# Patient Record
Sex: Female | Born: 1975 | Race: White | Hispanic: No | Marital: Married | Smoking: Never smoker
Health system: Southern US, Community
[De-identification: ages and names within clinical notes are randomized; demographics above are authoritative.]

---

## 1997-11-24 ENCOUNTER — Other Ambulatory Visit: Admission: RE | Admit: 1997-11-24 | Discharge: 1997-11-24 | Payer: Self-pay | Admitting: *Deleted

## 1998-04-19 ENCOUNTER — Other Ambulatory Visit: Admission: RE | Admit: 1998-04-19 | Discharge: 1998-04-19 | Payer: Self-pay | Admitting: *Deleted

## 1999-06-08 ENCOUNTER — Other Ambulatory Visit: Admission: RE | Admit: 1999-06-08 | Discharge: 1999-06-08 | Payer: Self-pay | Admitting: *Deleted

## 2000-06-17 ENCOUNTER — Other Ambulatory Visit: Admission: RE | Admit: 2000-06-17 | Discharge: 2000-06-17 | Payer: Self-pay | Admitting: *Deleted

## 2001-04-21 ENCOUNTER — Other Ambulatory Visit: Admission: RE | Admit: 2001-04-21 | Discharge: 2001-04-21 | Payer: Self-pay | Admitting: Obstetrics and Gynecology

## 2002-04-26 ENCOUNTER — Other Ambulatory Visit: Admission: RE | Admit: 2002-04-26 | Discharge: 2002-04-26 | Payer: Self-pay | Admitting: Obstetrics and Gynecology

## 2003-05-09 ENCOUNTER — Other Ambulatory Visit: Admission: RE | Admit: 2003-05-09 | Discharge: 2003-05-09 | Payer: Self-pay | Admitting: Obstetrics and Gynecology

## 2004-05-17 ENCOUNTER — Other Ambulatory Visit: Admission: RE | Admit: 2004-05-17 | Discharge: 2004-05-17 | Payer: Self-pay | Admitting: Obstetrics and Gynecology

## 2005-09-10 ENCOUNTER — Ambulatory Visit: Payer: Self-pay | Admitting: Internal Medicine

## 2006-10-09 ENCOUNTER — Ambulatory Visit: Payer: Self-pay | Admitting: Internal Medicine

## 2006-10-09 ENCOUNTER — Encounter: Payer: Self-pay | Admitting: Internal Medicine

## 2007-07-07 ENCOUNTER — Ambulatory Visit: Payer: Self-pay | Admitting: Internal Medicine

## 2007-08-05 ENCOUNTER — Ambulatory Visit: Payer: Self-pay | Admitting: Internal Medicine

## 2008-01-05 ENCOUNTER — Telehealth: Payer: Self-pay | Admitting: Internal Medicine

## 2008-01-05 ENCOUNTER — Ambulatory Visit: Payer: Self-pay | Admitting: Internal Medicine

## 2008-01-14 ENCOUNTER — Telehealth: Payer: Self-pay | Admitting: Internal Medicine

## 2008-10-03 ENCOUNTER — Ambulatory Visit: Payer: Self-pay | Admitting: Internal Medicine

## 2008-10-03 DIAGNOSIS — J069 Acute upper respiratory infection, unspecified: Secondary | ICD-10-CM | POA: Insufficient documentation

## 2008-10-03 DIAGNOSIS — J309 Allergic rhinitis, unspecified: Secondary | ICD-10-CM | POA: Insufficient documentation

## 2011-09-17 ENCOUNTER — Ambulatory Visit (INDEPENDENT_AMBULATORY_CARE_PROVIDER_SITE_OTHER): Payer: BC Managed Care – PPO | Admitting: Internal Medicine

## 2011-09-17 ENCOUNTER — Encounter: Payer: Self-pay | Admitting: Internal Medicine

## 2011-09-17 VITALS — BP 106/76 | HR 101 | Temp 98.1°F | Ht 65.5 in | Wt 137.0 lb

## 2011-09-17 DIAGNOSIS — M4120 Other idiopathic scoliosis, site unspecified: Secondary | ICD-10-CM

## 2011-09-17 DIAGNOSIS — J309 Allergic rhinitis, unspecified: Secondary | ICD-10-CM

## 2011-09-17 DIAGNOSIS — Z136 Encounter for screening for cardiovascular disorders: Secondary | ICD-10-CM

## 2011-09-17 DIAGNOSIS — Z0000 Encounter for general adult medical examination without abnormal findings: Secondary | ICD-10-CM

## 2011-09-17 DIAGNOSIS — M419 Scoliosis, unspecified: Secondary | ICD-10-CM

## 2011-09-17 DIAGNOSIS — Z8249 Family history of ischemic heart disease and other diseases of the circulatory system: Secondary | ICD-10-CM

## 2011-09-17 DIAGNOSIS — J302 Other seasonal allergic rhinitis: Secondary | ICD-10-CM

## 2011-09-17 LAB — BASIC METABOLIC PANEL
Calcium: 9.2 mg/dL (ref 8.4–10.5)
Creatinine, Ser: 0.8 mg/dL (ref 0.4–1.2)
GFR: 87.04 mL/min (ref >60.00)
Sodium: 141 mEq/L (ref 135–145)

## 2011-09-17 LAB — CBC WITH DIFFERENTIAL/PLATELET
Basophils Absolute: 0 10*3/uL (ref 0.0–0.1)
Basophils Relative: 0.3 % (ref 0.0–3.0)
Eosinophils Absolute: 0.1 10*3/uL (ref 0.0–0.7)
Hemoglobin: 12.6 g/dL (ref 12.0–15.0)
Lymphocytes Relative: 22 % (ref 12.0–46.0)
MCHC: 33.5 g/dL (ref 30.0–36.0)
Monocytes Relative: 6.2 % (ref 3.0–12.0)
Neutro Abs: 4.1 10*3/uL (ref 1.4–7.7)
Neutrophils Relative %: 69.1 % (ref 43.0–77.0)
RBC: 3.97 Mil/uL (ref 3.87–5.11)

## 2011-09-17 LAB — HEPATIC FUNCTION PANEL
AST: 21 U/L (ref 0–37)
Albumin: 4.3 g/dL (ref 3.5–5.2)
Alkaline Phosphatase: 36 U/L — ABNORMAL LOW (ref 39–117)
Bilirubin, Direct: 0 mg/dL (ref 0.0–0.3)

## 2011-09-17 LAB — LIPID PANEL
HDL: 64.8 mg/dL (ref >39.00)
LDL Cholesterol: 58 mg/dL (ref 0–99)
Total CHOL/HDL Ratio: 2
Triglycerides: 126 mg/dL (ref 0.0–149.0)

## 2011-09-17 NOTE — Progress Notes (Signed)
Subjective:    Patient ID: Holly Waters, female    DOB: 10-21-1975, 36 y.o.   MRN: [id number]  HPI Patient comes in as new patient visit . Previous care here was as a child   to reestablish  And get PV also to discuss recent famil hx dx of HCCm in mom when she presented with acute gi gall stone problem . She has not had syncope cp excessive sob arrythmia by hx and no sudden death in the family.  Going to [place]  [address] feet and to do hiking.  In [place] late [place] fall  And wants to make sure no preventable risk.  On ocps for year the last one for 4 years .  Review of Systems ROS:  GEN/ HEENT: No fever, significant weight changes sweats headaches vision problems hearing changes, CV/ PULM; No chest pain shortness of breath cough, syncope,edema  change in exercise tolerance. GI /GU: No adominal pain, vomiting, change in bowel habits. No blood in the stool. No significant GU symptoms. SKIN/HEME: ,no acute skin rashes suspicious lesions or bleeding. No lymphadenopathy, nodules, masses.  NEURO/ PSYCH:  No neurologic signs such as weakness numbness. No depression anxiety. IMM/ Allergy: No unusual infections.  Allergy .   On meds  With help REST of 12 system review negative except as per HPI Outpatient Encounter Prescriptions as of 09/17/2011  Medication Sig Dispense Refill  . drospirenone-ethinyl estradiol (YAZ,GIANVI,LORYNA) 3-0.02 MG tablet Take 1 tablet by mouth daily.      . fluticasone (FLONASE) 50 MCG/ACT nasal spray Place 2 sprays into the nose daily.       Past Medical History  Diagnosis Date  . Allergic rhinitis     History   Social History  . Marital Status: Married    Spouse Name: N/A    Number of Children: N/A  . Years of Education: N/A   Occupational History  . Not on file.   Social History Main Topics  . Smoking status: Never Smoker   . Smokeless tobacco: Not on file  . Alcohol Use: Yes  . Drug Use: No  . Sexually Active: Not on file   Other Topics Concern  . 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 No pertinent past surgical history.  Family History  Problem Relation Age of Onset  . Healthy Mother   . Multiple sclerosis Father   . Hypertension Father     both parents  . Diabetes Father   . Hypertrophic cardiomyopathy Mother   . Hypertension Mother     Not on File  Current Outpatient Prescriptions on File Prior to Visit  Medication Sig Dispense Refill  . drospirenone-ethinyl estradiol (YAZ,GIANVI,LORYNA) 3-0.02 MG tablet Take 1 tablet by mouth daily.      . fluticasone (FLONASE) 50 MCG/ACT nasal spray Place 2 sprays into the nose daily.        BP 106/76  Pulse 101  Temp(Src) 98.1 F (36.7 C) (Oral)  Ht 5' 5.5" (1.664 m)  Wt 137 lb (62.143 kg)  BMI 22.45 kg/m2  LMP 09/10/2011        Objective:   Physical Exam BP 106/76  Pulse 101  Temp(Src) 98.1 F (36.7 C) (Oral)  Ht 5' 5.5" (1.664 m)  Wt 137 lb (62.143 kg)  BMI 22.45 kg/m2  LMP 09/10/2011 Physical Exam: Vital  signs reviewed ZOX:WRUE is a well-developed well-nourished alert cooperative  white female who appears her stated age in no acute distress.  HEENT: normocephalic atraumatic , Eyes: PERRL EOM's full, conjunctiva clear, Nares: paten,t no deformity discharge or tenderness., Ears: no deformity EAC's clear TMs with normal landmarks. Mouth: clear OP, no lesions, edema.  Moist mucous membranes. Dentition in adequate repair. NECK: supple without masses, thyromegaly or bruits. CHEST/PULM:  Clear to auscultation and percussion breath sounds equal no wheeze , rales or rhonchi. No chest wall deformities or tenderness. Breast: normal by inspection . No dimpling, discharge, masses, tenderness or discharge .  CV: PMI is nondisplaced, S1 S2 no gallops, murmurs, rubs. Peripheral pulses are full without delay.No JVD . No m heard in 3  position and valsalva  ABDOMEN: Bowel sounds normal nontender  No guard or rebound, no hepato splenomegal no CVA tenderness.  No hernia. Extremtities:  No clubbing cyanosis or edema, no acute joint swelling or redness no focal atrophy NEURO:  Oriented x3, cranial nerves 3-12 appear to be intact, no obvious focal weakness,gait within normal limits no abnormal reflexes or asymmetrical SKIN: No acute rashes normal turgor, color, no bruising or petechiae. PSYCH: Oriented, good eye contact, no obvious depression anxiety, cognition and judgment appear normal. LN: no cervical axillary inguinal adenopathy   EKG  nsr with short pr  No delta waves nl voltage.      Assessment & Plan:  Preventive Health Care Counseled regarding healthy nutrition, exercise, sleep, injury prevention, calcium vit d and healthy weight . Fam hx first degree  HCM no sx of hx of arrythmia or sudden death or syncope otherwise per hx  Allergic rhinitis stable OCPS per  [facility]

## 2011-09-17 NOTE — Patient Instructions (Signed)
Will notify you  of labs when available. Your exam looks good today.  Will arrange echo or sound wave test of the heart .  It may require you see a cardiologist .  In which case we refer for this.

## 2011-09-19 NOTE — Progress Notes (Signed)
Quick Note:    Copy mailed to pt  ______

## 2011-10-01 ENCOUNTER — Other Ambulatory Visit: Payer: Self-pay

## 2011-10-01 ENCOUNTER — Ambulatory Visit (HOSPITAL_COMMUNITY): Payer: BC Managed Care – PPO | Attending: Cardiology

## 2011-10-01 DIAGNOSIS — M419 Scoliosis, unspecified: Secondary | ICD-10-CM

## 2011-10-01 DIAGNOSIS — Z8249 Family history of ischemic heart disease and other diseases of the circulatory system: Secondary | ICD-10-CM

## 2011-10-01 DIAGNOSIS — Z136 Encounter for screening for cardiovascular disorders: Secondary | ICD-10-CM

## 2011-10-08 ENCOUNTER — Telehealth: Payer: Self-pay | Admitting: Family Medicine

## 2011-10-08 NOTE — Telephone Encounter (Signed)
Pls advise if pt can be given results.

## 2011-10-08 NOTE — Telephone Encounter (Signed)
Tell her the ECHO was normal. No signs of hypertrophic disease

## 2011-10-08 NOTE — Telephone Encounter (Signed)
Pt had 2D echo last Tues. She is still waiting for [facility] to call her with results. Can another provider review and call pt? Please advise. Thanks

## 2011-10-09 NOTE — Telephone Encounter (Signed)
Left a message for pt to return call about results.

## 2011-10-09 NOTE — Telephone Encounter (Signed)
Called and spoke with pt and pt is aware of Echo results.

## 2011-10-09 NOTE — Telephone Encounter (Signed)
Pt returned call. Pls call back asap.    °

## 2011-12-10 ENCOUNTER — Encounter (HOSPITAL_COMMUNITY): Payer: Self-pay | Admitting: Anesthesiology

## 2011-12-10 ENCOUNTER — Ambulatory Visit (HOSPITAL_COMMUNITY)
Admission: EM | Admit: 2011-12-10 | Discharge: 2011-12-11 | Disposition: A | Discharge status: Home / Self Care | Payer: BC Managed Care – PPO | Attending: Surgery | Admitting: Surgery

## 2011-12-10 ENCOUNTER — Inpatient Hospital Stay (HOSPITAL_COMMUNITY): Admit: 2011-12-10 | Payer: Self-pay

## 2011-12-10 ENCOUNTER — Emergency Department (HOSPITAL_COMMUNITY): Payer: BC Managed Care – PPO

## 2011-12-10 ENCOUNTER — Emergency Department (HOSPITAL_COMMUNITY): Payer: BC Managed Care – PPO | Admitting: Anesthesiology

## 2011-12-10 ENCOUNTER — Encounter (HOSPITAL_COMMUNITY)
Admission: EM | Disposition: A | Discharge status: Home / Self Care | Payer: Self-pay | Source: Home / Self Care | Attending: Emergency Medicine

## 2011-12-10 ENCOUNTER — Encounter (HOSPITAL_COMMUNITY): Payer: Self-pay

## 2011-12-10 DIAGNOSIS — K8000 Calculus of gallbladder with acute cholecystitis without obstruction: Secondary | ICD-10-CM | POA: Insufficient documentation

## 2011-12-10 DIAGNOSIS — R1011 Right upper quadrant pain: Secondary | ICD-10-CM

## 2011-12-10 DIAGNOSIS — K8020 Calculus of gallbladder without cholecystitis without obstruction: Secondary | ICD-10-CM

## 2011-12-10 DIAGNOSIS — R109 Unspecified abdominal pain: Secondary | ICD-10-CM

## 2011-12-10 DIAGNOSIS — J309 Allergic rhinitis, unspecified: Secondary | ICD-10-CM | POA: Insufficient documentation

## 2011-12-10 HISTORY — PX: CHOLECYSTECTOMY: SHX55

## 2011-12-10 LAB — CBC WITH DIFFERENTIAL/PLATELET
Basophils Absolute: 0 10*3/uL (ref 0.0–0.1)
Lymphocytes Relative: 13 % (ref 12–46)
Lymphs Abs: 1.3 10*3/uL (ref 0.7–4.0)
MCV: 95.4 fL (ref 78.0–100.0)
Neutro Abs: 7.6 10*3/uL (ref 1.7–7.7)
Neutrophils Relative %: 80 % — ABNORMAL HIGH (ref 43–77)
Platelets: 265 10*3/uL (ref 150–400)
RBC: 4.15 MIL/uL (ref 3.87–5.11)
RDW: 13.5 % (ref 11.5–15.5)
WBC: 9.5 10*3/uL (ref 4.0–10.5)

## 2011-12-10 LAB — COMPREHENSIVE METABOLIC PANEL
ALT: 45 U/L — ABNORMAL HIGH (ref 0–35)
AST: 28 U/L (ref 0–37)
Alkaline Phosphatase: 65 U/L (ref 39–117)
CO2: 18 mEq/L — ABNORMAL LOW (ref 19–32)
Chloride: 100 mEq/L (ref 96–112)
GFR calc Af Amer: >90 mL/min (ref >90)
GFR calc non Af Amer: >90 mL/min (ref >90)
Glucose, Bld: 100 mg/dL — ABNORMAL HIGH (ref 70–99)
Potassium: 4.1 mEq/L (ref 3.5–5.1)
Sodium: 133 mEq/L — ABNORMAL LOW (ref 135–145)

## 2011-12-10 LAB — URINALYSIS, ROUTINE W REFLEX MICROSCOPIC
Leukocytes, UA: NEGATIVE
Nitrite: NEGATIVE
Specific Gravity, Urine: 1.006 (ref 1.005–1.030)
Urobilinogen, UA: 0.2 mg/dL (ref 0.0–1.0)

## 2011-12-10 LAB — URINE MICROSCOPIC-ADD ON

## 2011-12-10 SURGERY — LAPAROSCOPIC CHOLECYSTECTOMY WITH INTRAOPERATIVE CHOLANGIOGRAM
Anesthesia: General | Site: Abdomen | Wound class: Clean Contaminated

## 2011-12-10 MED ORDER — SODIUM CHLORIDE 0.9 % IV SOLN
INTRAVENOUS | Status: DC | PRN
  Administered 2011-12-10: 16:00:00

## 2011-12-10 MED ORDER — HYDROMORPHONE HCL PF 1 MG/ML IJ SOLN
1.0000 mg | INTRAMUSCULAR | Status: DC | PRN
  Administered 2011-12-10 (×2): 1 mg via INTRAVENOUS
  Filled 2011-12-10 (×2): qty 1

## 2011-12-10 MED ORDER — OXYCODONE-ACETAMINOPHEN 5-325 MG PO TABS
1.0000 | ORAL_TABLET | ORAL | Status: DC | PRN
  Administered 2011-12-11 (×2): 1 via ORAL
  Filled 2011-12-10 (×2): qty 1

## 2011-12-10 MED ORDER — ONDANSETRON HCL 4 MG/2ML IJ SOLN: 4.0000 mg | Freq: Four times a day (QID) | INTRAMUSCULAR | Status: DC | PRN

## 2011-12-10 MED ORDER — BUPIVACAINE-EPINEPHRINE 0.25% -1:200000 IJ SOLN
INTRAMUSCULAR | Status: DC | PRN
  Administered 2011-12-10: 13 mL

## 2011-12-10 MED ORDER — LACTATED RINGERS IV SOLN
INTRAVENOUS | Status: DC | PRN
  Administered 2011-12-10 (×2): via INTRAVENOUS

## 2011-12-10 MED ORDER — ONDANSETRON HCL 4 MG PO TABS: 4.0000 mg | ORAL_TABLET | Freq: Four times a day (QID) | ORAL | Status: DC | PRN

## 2011-12-10 MED ORDER — DEXTROSE 5 % IV SOLN
1.0000 g | INTRAVENOUS | Status: DC | PRN
  Administered 2011-12-10: 1 g via INTRAVENOUS

## 2011-12-10 MED ORDER — DEXAMETHASONE SODIUM PHOSPHATE 4 MG/ML IJ SOLN
INTRAMUSCULAR | Status: DC | PRN
  Administered 2011-12-10: 4 mg via INTRAVENOUS

## 2011-12-10 MED ORDER — ROCURONIUM BROMIDE 100 MG/10ML IV SOLN
INTRAVENOUS | Status: DC | PRN
  Administered 2011-12-10: 50 mg via INTRAVENOUS

## 2011-12-10 MED ORDER — LACTATED RINGERS IV SOLN
INTRAVENOUS | Status: DC
  Administered 2011-12-10: 14:00:00 via INTRAVENOUS
  Administered 2011-12-11: 50 mL/h via INTRAVENOUS

## 2011-12-10 MED ORDER — NEOSTIGMINE METHYLSULFATE 1 MG/ML IJ SOLN
INTRAMUSCULAR | Status: DC | PRN
  Administered 2011-12-10: 3 mg via INTRAVENOUS

## 2011-12-10 MED ORDER — PROPOFOL 10 MG/ML IV EMUL
INTRAVENOUS | Status: DC | PRN
  Administered 2011-12-10: 160 mg via INTRAVENOUS

## 2011-12-10 MED ORDER — ONDANSETRON HCL 4 MG/2ML IJ SOLN
INTRAMUSCULAR | Status: DC | PRN
  Administered 2011-12-10: 4 mg via INTRAVENOUS

## 2011-12-10 MED ORDER — MIDAZOLAM HCL 5 MG/5ML IJ SOLN
INTRAMUSCULAR | Status: DC | PRN
  Administered 2011-12-10: 2 mg via INTRAVENOUS

## 2011-12-10 MED ORDER — GLYCOPYRROLATE 0.2 MG/ML IJ SOLN
INTRAMUSCULAR | Status: DC | PRN
  Administered 2011-12-10: 0.4 mg via INTRAVENOUS

## 2011-12-10 MED ORDER — HYDROMORPHONE HCL PF 1 MG/ML IJ SOLN
0.2500 mg | INTRAMUSCULAR | Status: DC | PRN
  Administered 2011-12-10 (×2): 0.25 mg via INTRAVENOUS

## 2011-12-10 MED ORDER — SODIUM CHLORIDE 0.9 % IR SOLN
Status: DC | PRN
  Administered 2011-12-10 (×2): 1

## 2011-12-10 MED ORDER — FENTANYL CITRATE 0.05 MG/ML IJ SOLN
INTRAMUSCULAR | Status: DC | PRN
  Administered 2011-12-10 (×2): 100 ug via INTRAVENOUS
  Administered 2011-12-10 (×2): 50 ug via INTRAVENOUS
  Administered 2011-12-10: 100 ug via INTRAVENOUS
  Administered 2011-12-10: 50 ug via INTRAVENOUS

## 2011-12-10 MED ORDER — 0.9 % SODIUM CHLORIDE (POUR BTL) OPTIME
TOPICAL | Status: DC | PRN
  Administered 2011-12-10: 1000 mL

## 2011-12-10 MED ORDER — PHENYLEPHRINE HCL 10 MG/ML IJ SOLN
INTRAMUSCULAR | Status: DC | PRN
  Administered 2011-12-10: 80 ug via INTRAVENOUS

## 2011-12-10 MED ORDER — LIDOCAINE HCL 1 % IJ SOLN
INTRAMUSCULAR | Status: DC | PRN
  Administered 2011-12-10: 70 mg via INTRADERMAL

## 2011-12-10 MED ORDER — ENOXAPARIN SODIUM 40 MG/0.4ML ~~LOC~~ SOLN
40.0000 mg | SUBCUTANEOUS | Status: DC
  Administered 2011-12-11: 40 mg via SUBCUTANEOUS
  Filled 2011-12-10 (×2): qty 0.4

## 2011-12-10 SURGICAL SUPPLY — 41 items
APPLIER CLIP ROT 10 11.4 M/L (STAPLE) ×4
BLADE SURG ROTATE 9660 (MISCELLANEOUS) IMPLANT
CANISTER SUCTION 2500CC (MISCELLANEOUS) ×2 IMPLANT
CHLORAPREP W/TINT 26ML (MISCELLANEOUS) ×2 IMPLANT
CLIP APPLIE ROT 10 11.4 M/L (STAPLE) ×2 IMPLANT
CLOTH BEACON ORANGE TIMEOUT ST (SAFETY) ×2 IMPLANT
COVER MAYO STAND STRL (DRAPES) ×2 IMPLANT
COVER SURGICAL LIGHT HANDLE (MISCELLANEOUS) ×2 IMPLANT
DECANTER SPIKE VIAL GLASS SM (MISCELLANEOUS) ×4 IMPLANT
DERMABOND ADVANCED (GAUZE/BANDAGES/DRESSINGS) ×1
DERMABOND ADVANCED .7 DNX12 (GAUZE/BANDAGES/DRESSINGS) ×1 IMPLANT
DRAPE C-ARM 42X72 X-RAY (DRAPES) ×2 IMPLANT
DRAPE UTILITY 15X26 W/TAPE STR (DRAPE) ×4 IMPLANT
DRAPE WARM FLUID 44X44 (DRAPE) ×2 IMPLANT
ELECT REM PT RETURN 9FT ADLT (ELECTROSURGICAL) ×2
ELECTRODE REM PT RTRN 9FT ADLT (ELECTROSURGICAL) ×1 IMPLANT
GLOVE BIO SURGEON STRL SZ 6.5 (GLOVE) ×2 IMPLANT
GLOVE BIO SURGEON STRL SZ8 (GLOVE) ×2 IMPLANT
GLOVE BIOGEL PI IND STRL 6.5 (GLOVE) ×1 IMPLANT
GLOVE BIOGEL PI IND STRL 8 (GLOVE) ×1 IMPLANT
GLOVE BIOGEL PI INDICATOR 6.5 (GLOVE) ×1
GLOVE BIOGEL PI INDICATOR 8 (GLOVE) ×1
GOWN STRL NON-REIN LRG LVL3 (GOWN DISPOSABLE) ×8 IMPLANT
KIT BASIN OR (CUSTOM PROCEDURE TRAY) ×2 IMPLANT
KIT ROOM TURNOVER OR (KITS) ×2 IMPLANT
NS IRRIG 1000ML POUR BTL (IV SOLUTION) ×4 IMPLANT
PAD ARMBOARD 7.5X6 YLW CONV (MISCELLANEOUS) ×2 IMPLANT
POUCH SPECIMEN RETRIEVAL 10MM (ENDOMECHANICALS) ×2 IMPLANT
SCISSORS LAP 5X35 DISP (ENDOMECHANICALS) IMPLANT
SET CHOLANGIOGRAPH 5 50 .035 (SET/KITS/TRAYS/PACK) ×2 IMPLANT
SET IRRIG TUBING LAPAROSCOPIC (IRRIGATION / IRRIGATOR) ×2 IMPLANT
SLEEVE ENDOPATH XCEL 5M (ENDOMECHANICALS) ×2 IMPLANT
SPECIMEN JAR SMALL (MISCELLANEOUS) ×2 IMPLANT
SUT MNCRL AB 4-0 PS2 18 (SUTURE) ×2 IMPLANT
SUT VICRYL 0 UR6 27IN ABS (SUTURE) ×4 IMPLANT
TOWEL OR 17X24 6PK STRL BLUE (TOWEL DISPOSABLE) ×2 IMPLANT
TOWEL OR 17X26 10 PK STRL BLUE (TOWEL DISPOSABLE) ×2 IMPLANT
TRAY LAPAROSCOPIC (CUSTOM PROCEDURE TRAY) ×2 IMPLANT
TROCAR XCEL BLUNT TIP 100MML (ENDOMECHANICALS) ×2 IMPLANT
TROCAR XCEL NON-BLD 11X100MML (ENDOMECHANICALS) ×2 IMPLANT
TROCAR XCEL NON-BLD 5MMX100MML (ENDOMECHANICALS) ×2 IMPLANT

## 2011-12-10 NOTE — Interval H&P Note (Signed)
History and Physical Interval Note:  12/10/2011 2:08 PM  Holly Waters  has presented today for surgery, with the diagnosis of gall bladder disease  The various methods of treatment have been discussed with the patient and family. After consideration of risks, benefits and other options for treatment, the patient has consented to  Procedure(s) (LRB): LAPAROSCOPIC CHOLECYSTECTOMY WITH INTRAOPERATIVE CHOLANGIOGRAM (N/A) as a surgical intervention .  The patient's history has been reviewed, patient examined, no change in status, stable for surgery.  I have reviewed the patient's chart and labs.  Questions were answered to the patient's satisfaction.     Elly Haffey A.   

## 2011-12-10 NOTE — ED Notes (Signed)
Pt c/o constant epigastric and RUQ pain since Sunday that "feels like acid". Pt denies N/V, but reports decreased appetite. States she was seem at urgent care and sent to ED for further eval. MD at bedside

## 2011-12-10 NOTE — ED Provider Notes (Signed)
Patient presented with RUQ pain.   [place]: Cholelithiasis with a 2.8 cm diameter gallstones fixed in position  at the gallbladder neck with associated sonographic Murphy's sign,  cannot exclude early acute cholecystitis.  No biliary dilatation.  Patient reassessed by Dr. Weldon Inches and still tender in RUQ. Surgical consult called by physician and patient admitted.   Pixie Casino, PA-C 12/10/11 1552

## 2011-12-10 NOTE — Anesthesia Preprocedure Evaluation (Addendum)
Anesthesia Evaluation  Patient identified by MRN, date of birth, ID band Patient awake    Reviewed: Allergy & Precautions, H&P , NPO status , Patient's Chart, lab work & pertinent test results  History of Anesthesia Complications Negative for: history of anesthetic complications  Airway Mallampati: I TM Distance: >3 FB Neck ROM: Full    Dental  (+) Teeth Intact and Dental Advisory Given   Pulmonary neg pulmonary ROS,          Cardiovascular negative cardio ROS      Neuro/Psych negative neurological ROS     GI/Hepatic Neg liver ROS, GB disease   Endo/Other  negative endocrine ROS  Renal/GU negative Renal ROS     Musculoskeletal negative musculoskeletal ROS (+)   Abdominal   Peds  Hematology negative hematology ROS (+)   Anesthesia Other Findings   Reproductive/Obstetrics LMP 11/30/11                           Anesthesia Physical Anesthesia Plan  ASA: I  Anesthesia Plan: General   Post-op Pain Management:    Induction: Intravenous  Airway Management Planned: Oral ETT  Additional Equipment:   Intra-op Plan:   Post-operative Plan: Extubation in OR  Informed Consent:   Dental advisory given  Plan Discussed with: CRNA and Anesthesiologist  Anesthesia Plan Comments:         Anesthesia Quick Evaluation

## 2011-12-10 NOTE — ED Notes (Signed)
Pt to US with tech

## 2011-12-10 NOTE — ED Notes (Signed)
Pt sent here from [facility] for possible cholecystitis, symptom onset Sunday evening sts right upper abd pain.

## 2011-12-10 NOTE — Transfer of Care (Signed)
Immediate Anesthesia Transfer of Care Note  Patient: Holly Waters  Procedure(s) Performed: Procedure(s) (LRB): LAPAROSCOPIC CHOLECYSTECTOMY WITH INTRAOPERATIVE CHOLANGIOGRAM (N/A)  Patient Location: PACU  Anesthesia Type: General  Level of Consciousness: awake, alert  and oriented  Airway & Oxygen Therapy: Patient Spontanous Breathing  Post-op Assessment: Report given to PACU RN and Post -op Vital signs reviewed and stable  Post vital signs: Reviewed and stable  Complications: No apparent anesthesia complications

## 2011-12-10 NOTE — ED Notes (Addendum)
Patient to or via wheelchair.all belongings given to husband at bedside

## 2011-12-10 NOTE — H&P (Signed)
Pt seen examined and agree with h and p.  Acute cholecystitis on exam.  Recommend laparoscopic cholecystectomy and cholangiogram.  The procedure has been discussed with the patient. Operative and non operative treatments have been discussed. Risks of surgery include bleeding, infection,  Common bile duct injury,  Injury to the stomach,liver, colon,small intestine, abdominal wall,  Diaphragm,  Major blood vessels,  And the need for an open procedure.  Other risks include worsening of medical problems, death,  DVT and pulmonary embolism, and cardiovascular events.   Medical options have also been discussed. The patient has been informed of long term expectations of surgery and non surgical options,  The patient agrees to proceed.

## 2011-12-10 NOTE — Anesthesia Postprocedure Evaluation (Signed)
Anesthesia Post Note  Patient: Holly Waters  Procedure(s) Performed: Procedure(s) (LRB): LAPAROSCOPIC CHOLECYSTECTOMY WITH INTRAOPERATIVE CHOLANGIOGRAM (N/A)  Anesthesia type: General  Patient location: PACU  Post pain: Pain level controlled and Adequate analgesia  Post assessment: Post-op Vital signs reviewed, Patient's Cardiovascular Status Stable, Respiratory Function Stable, Patent Airway and Pain level controlled  Last Vitals:  Filed Vitals:   12/10/11 1622  BP: 126/70  Pulse: 86  Temp: 36.2 C  Resp: 17    Post vital signs: Reviewed and stable  Level of consciousness: awake, alert  and oriented  Complications: No apparent anesthesia complications

## 2011-12-10 NOTE — Anesthesia Procedure Notes (Signed)
Procedure Name: Intubation Date/Time: 12/10/2011 2:22 PM Performed by: Leona Singleton A Pre-anesthesia Checklist: Patient identified Patient Re-evaluated:Patient Re-evaluated prior to inductionOxygen Delivery Method: Circle system utilized Preoxygenation: Pre-oxygenation with 100% oxygen Intubation Type: IV induction Ventilation: Mask ventilation without difficulty Laryngoscope Size: Miller and 2 Grade View: Grade I Tube type: Oral Tube size: 7.0 mm Number of attempts: 1 Airway Equipment and Method: Stylet Placement Confirmation: ETT inserted through vocal cords under direct vision,  positive ETCO2 and breath sounds checked- equal and bilateral Secured at: 21 cm Tube secured with: Tape Dental Injury: Teeth and Oropharynx as per pre-operative assessment

## 2011-12-10 NOTE — Interval H&P Note (Signed)
History and Physical Interval Note:  12/10/2011 2:08 PM  Holly Waters  has presented today for surgery, with the diagnosis of gall bladder disease  The various methods of treatment have been discussed with the patient and family. After consideration of risks, benefits and other options for treatment, the patient has consented to  Procedure(s) (LRB): LAPAROSCOPIC CHOLECYSTECTOMY WITH INTRAOPERATIVE CHOLANGIOGRAM (N/A) as a surgical intervention .  The patient's history has been reviewed, patient examined, no change in status, stable for surgery.  I have reviewed the patient's chart and labs.  Questions were answered to the patient's satisfaction.     Taejon Irani A.

## 2011-12-10 NOTE — Preoperative (Signed)
Beta Blockers   Reason not to administer Beta Blockers:Not Applicable 

## 2011-12-10 NOTE — Op Note (Signed)
Laparoscopic Cholecystectomy with IOC Procedure Note  Indications: This patient presents with symptomatic gallbladder disease and will undergo laparoscopic cholecystectomy.  Pre-operative Diagnosis: Calculus of gallbladder with acute cholecystitis, without mention of obstruction  Post-operative Diagnosis: Same  Surgeon: Izabelle Daus A.   Assistants: OR  staff  Anesthesia: General endotracheal anesthesia and Local anesthesia 0.25.% bupivacaine, with epinephrine  ASA Class: 1  Procedure Details  The patient was seen again in the Holding Room. The risks, benefits, complications, treatment options, and expected outcomes were discussed with the patient. The possibilities of reaction to medication, pulmonary aspiration, perforation of viscus, bleeding, recurrent infection, finding a normal gallbladder, the need for additional procedures, failure to diagnose a condition, the possible need to convert to an open procedure, and creating a complication requiring transfusion or operation were discussed with the patient. The patient and/or family concurred with the proposed plan, giving informed consent. The site of surgery properly noted/marked. The patient was taken to Operating Room, identified as Holly Waters and the procedure verified as Laparoscopic Cholecystectomy with Intraoperative Cholangiograms. A Time Out was held and the above information confirmed.  Prior to the induction of general anesthesia, antibiotic prophylaxis was administered. General endotracheal anesthesia was then administered and tolerated well. After the induction, the abdomen was prepped in the usual sterile fashion. The patient was positioned in the supine position with the left arm comfortably tucked, along with some reverse Trendelenburg.  Local anesthetic agent was injected into the skin near the umbilicus and an incision made. The midline fascia was incised and the Hasson technique was used to introduce a 12 mm port  under direct vision. It was secured with a figure of eight Vicryl suture placed in the usual fashion. Pneumoperitoneum was then created with CO2 and tolerated well without any adverse changes in the patient's vital signs. Additional 5 mm and an 11 mm epigastric trocars were introduced under direct vision. All skin incisions were infiltrated with a local anesthetic agent before making the incision and placing the trocars.   The gallbladder was identified, the fundus grasped and retracted cephalad. Adhesions were lysed bluntly and with the electrocautery where indicated, taking care not to injure any adjacent organs or viscus. The infundibulum was grasped and retracted laterally, exposing the peritoneum overlying the triangle of Calot. This was then divided and exposed in a blunt fashion. The cystic duct was clearly identified and bluntly dissected circumferentially. The junctions of the gallbladder, cystic duct and common bile duct were clearly identified prior to the division of any linear structure.   An incision was made in the cystic duct and the cholangiogram catheter introduced. The catheter was secured using an endoclip. The study showed no stones and good visualization of the distal and proximal biliary tree. The catheter was then removed.   The cystic duct was then  ligated with surgical clips  on the patient side and  clipped on the gallbladder side and divided. The cystic artery was identified, dissected free, ligated with clips and divided as well. Posterior cystic artery clipped and divided.  The gallbladder was dissected from the liver bed in retrograde fashion with the electrocautery. The gallbladder was removed. The liver bed was irrigated and inspected. Hemostasis was achieved with the electrocautery and clips. Copious irrigation was utilized and was repeatedly aspirated until clear all particulate matter.  Laparoscopy revealed no evidence of organ injury,  Bleeding or bile  leakage.  Pneumoperitoneum was completely reduced after viewing removal of the trocars under direct vision. The wound was thoroughly  irrigated and the fascia was then closed with a figure of eight suture; the skin was then closed with 4 O monocryl  and a sterile Dermabond was applied.  Instrument, sponge, and needle counts were correct at closure and at the conclusion of the case.   Findings: Cholecystitis with Cholelithiasis  Estimated Blood Loss: less than 50 mL         Drains: none         Total IV Fluids: 1500 mL         Specimens: Gallbladder           Complications: None; patient tolerated the procedure well.         Disposition: PACU - hemodynamically stable.         Condition: stable

## 2011-12-10 NOTE — ED Provider Notes (Signed)
History     CSN: [id number]  Arrival date & time 12/10/11  1009   First MD Initiated Contact with Patient 12/10/11 1036      Chief Complaint  Patient presents with  . Abdominal Pain    (Consider location/radiation/quality/duration/timing/severity/associated sxs/prior treatment) Patient is a 36 y.o. female presenting with abdominal pain. The history is provided by the patient.  Abdominal Pain The primary symptoms of the illness include abdominal pain. The primary symptoms of the illness do not include fever, shortness of breath, nausea, vomiting, diarrhea or dysuria.  Symptoms associated with the illness do not include chills or back pain.   the patient is a 36 year old, female, with no significant past medical history, who presents to the emergency department complaining of epigastric abdominal pain, with a burning sensation in her chest.  The pain does not radiate.  It has been constant for the past 3 days.  She has a decreased appetite.  She denies cough, or shortness of breath.  She denies nausea, vomiting, diarrhea, or urinary tract symptoms.  She denies alcohol use and she denies prior abdominal surgery.  She is taking birth control pills  Past Medical History  Diagnosis Date  . Allergic rhinitis     No past surgical history on file.  Family History  Problem Relation Age of Onset  . Healthy Mother   . Multiple sclerosis Father   . Hypertension Father     both parents  . Diabetes Father   . Hypertrophic cardiomyopathy Mother   . Hypertension Mother     History  Substance Use Topics  . Smoking status: Never Smoker   . Smokeless tobacco: Not on file  . Alcohol Use: Yes     soically    OB History    Grav Para Term Preterm Abortions TAB SAB Ect Mult Living                  Review of Systems  Constitutional: Negative for fever and chills.  HENT: Negative for congestion.   Eyes: Negative for redness.  Respiratory: Negative for cough and shortness of breath.     Cardiovascular: Negative for chest pain.  Gastrointestinal: Positive for abdominal pain. Negative for nausea, vomiting and diarrhea.  Genitourinary: Negative for dysuria.  Musculoskeletal: Negative for back pain.  Skin: Negative for rash.  Neurological: Negative for headaches.  Psychiatric/Behavioral: Negative for confusion.  All other systems reviewed and are negative.    Allergies  Review of patient's allergies indicates no known allergies.  Home Medications   Current Outpatient Rx  Name Route Sig Dispense Refill  . DROSPIRENONE-ETHINYL ESTRADIOL 3-0.02 MG PO TABS Oral Take 1 tablet by mouth daily.    Marland Kitchen FLUTICASONE PROPIONATE 50 MCG/ACT NA SUSP Nasal Place 2 sprays into the nose daily as needed. allergies      BP 125/87  Pulse 79  Temp 98.3 F (36.8 C) (Oral)  Resp 16  SpO2 100%  LMP 12/03/2011  Physical Exam  Nursing note and vitals reviewed. Constitutional: She is oriented to person, place, and time. She appears well-developed and well-nourished.  HENT:  Head: Normocephalic and atraumatic.  Eyes: Conjunctivae are normal.  Neck: Normal range of motion. Neck supple.  Cardiovascular: Normal rate.   No murmur heard. Pulmonary/Chest: Effort normal and breath sounds normal.  Abdominal: Soft. She exhibits no distension and no mass. There is tenderness. There is no rebound and no guarding.       Epigastric and right upper quadrant tenderness, with positive Murphy sign  Musculoskeletal: Normal range of motion.  Neurological: She is alert and oriented to person, place, and time.  Skin: Skin is warm and dry.  Psychiatric: She has a normal mood and affect. Thought content normal.    ED Course  Procedures (including critical care time) 36 year old, female, with no past medical history on birth control pills, presents emergency department with epigastric pain for the past 3 days.  No other symptoms.  She said a positive Murphy sign.  We will perform laboratory testing, and  ultrasound, for evaluation.  Presently, she does not want pain medications  Labs Reviewed  CBC WITH DIFFERENTIAL - Abnormal; Notable for the following:    Neutrophils Relative 80 (*)     All other components within normal limits  COMPREHENSIVE METABOLIC PANEL - Abnormal; Notable for the following:    Sodium 133 (*)     CO2 18 (*)     Glucose, Bld 100 (*)     BUN 5 (*)     ALT 45 (*)     All other components within normal limits  CK  POCT PREGNANCY, URINE  URINALYSIS, ROUTINE W REFLEX MICROSCOPIC   No results found.   No diagnosis found.  We'll send to be CDU for completion of her testing.  The PA will evaluate the ultrasound, and laboratory values and then reassess.  The patient and make final disposition  MDM  Abdominal pain        Cheri Guppy, MD 12/11/11 1958

## 2011-12-10 NOTE — H&P (Signed)
Holly Waters is an 36 y.o. female.   Chief Complaint: abdominal pain HPI: This is a 36 yo WF who presents to [facility] today after being seen at the [facility] for c/o of RUQ abdominal pain which has been more or less constant since last Sunday. She describes this pain as a " burning sensation" and initially thought it was heartburn, and took some antacids for it which did not alleviate her symptoms. Her symptoms are not affected by food, but she states that she has not felt like eating since this began. She has never had this kind of pain before and has had no previous abdominal surgeries. She states that she has had + flatus and BM + nausea but no emesis.  Her last attempt at taking any food was yesterday.  Past Medical History  Diagnosis Date  . Allergic rhinitis     No past surgical history on file.  Family History  Problem Relation Age of Onset  . Healthy Mother   . Multiple sclerosis Father   . Hypertension Father     both parents  . Diabetes Father   . Hypertrophic cardiomyopathy Mother   . Hypertension Mother    Social History:  reports that she has never smoked. She does not have any smokeless tobacco history on file. She reports that she drinks alcohol. She reports that she does not use illicit drugs.  Allergies: Codeine (causes her to itch)   (Not in a hospital admission)  Results for orders placed during the hospital encounter of 12/10/11 (from the past 48 hour(s))  CBC WITH DIFFERENTIAL     Status: Abnormal   Collection Time   12/10/11 10:26 AM      Component Value Range Comment   WBC 9.5  4.0 - 10.5 K/uL    RBC 4.15  3.87 - 5.11 MIL/uL    Hemoglobin 13.4  12.0 - 15.0 g/dL    HCT 16.1  09.6 - 04.5 %    MCV 95.4  78.0 - 100.0 fL    MCH 32.3  26.0 - 34.0 pg    MCHC 33.8  30.0 - 36.0 g/dL    RDW 40.9  81.1 - 91.4 %    Platelets 265  150 - 400 K/uL    Neutrophils Relative 80 (*) 43 - 77 %    Neutro Abs 7.6  1.7 - 7.7 K/uL    Lymphocytes Relative 13  12 - 46 %    Lymphs  Abs 1.3  0.7 - 4.0 K/uL    Monocytes Relative 6  3 - 12 %    Monocytes Absolute 0.5  0.1 - 1.0 K/uL    Eosinophils Relative 1  0 - 5 %    Eosinophils Absolute 0.1  0.0 - 0.7 K/uL    Basophils Relative 0  0 - 1 %    Basophils Absolute 0.0  0.0 - 0.1 K/uL   COMPREHENSIVE METABOLIC PANEL     Status: Abnormal   Collection Time   12/10/11 10:26 AM      Component Value Range Comment   Sodium 133 (*) 135 - 145 mEq/L    Potassium 4.1  3.5 - 5.1 mEq/L    Chloride 100  96 - 112 mEq/L    CO2 18 (*) 19 - 32 mEq/L    Glucose, Bld 100 (*) 70 - 99 mg/dL    BUN 5 (*) 6 - 23 mg/dL    Creatinine, Ser 7.82  0.50 - 1.10 mg/dL  Calcium 9.6  8.4 - 10.5 mg/dL    Total Protein 7.7  6.0 - 8.3 g/dL    Albumin 4.2  3.5 - 5.2 g/dL    AST 28  0 - 37 U/L HEMOLYSIS AT THIS LEVEL MAY AFFECT RESULT   ALT 45 (*) 0 - 35 U/L    Alkaline Phosphatase 65  39 - 117 U/L    Total Bilirubin 0.3  0.3 - 1.2 mg/dL    GFR calc non Af Amer >90  >90 mL/min    GFR calc Af Amer >90  >90 mL/min   CK     Status: Normal   Collection Time   12/10/11 10:26 AM      Component Value Range Comment   Total CK 93  7 - 177 U/L   URINALYSIS, ROUTINE W REFLEX MICROSCOPIC     Status: Abnormal   Collection Time   12/10/11 10:30 AM      Component Value Range Comment   Color, Urine YELLOW  YELLOW    APPearance CLEAR  CLEAR    Specific Gravity, Urine 1.006  1.005 - 1.030    pH 6.0  5.0 - 8.0    Glucose, UA NEGATIVE  NEGATIVE mg/dL    Hgb urine dipstick TRACE (*) NEGATIVE    Bilirubin Urine NEGATIVE  NEGATIVE    Ketones, ur NEGATIVE  NEGATIVE mg/dL    Protein, ur NEGATIVE  NEGATIVE mg/dL    Urobilinogen, UA 0.2  0.0 - 1.0 mg/dL    Nitrite NEGATIVE  NEGATIVE    Leukocytes, UA NEGATIVE  NEGATIVE   URINE MICROSCOPIC-ADD ON     Status: Normal   Collection Time   12/10/11 10:30 AM      Component Value Range Comment   Squamous Epithelial / LPF RARE  RARE    WBC, UA 0-2  <3 WBC/hpf    RBC / HPF 0-2  <3 RBC/hpf    Bacteria, UA RARE  RARE     POCT PREGNANCY, URINE     Status: Normal   Collection Time   12/10/11 10:59 AM      Component Value Range Comment   Preg Test, Ur NEGATIVE  NEGATIVE    US Abdomen Complete  12/10/2011  *RADIOLOGY REPORT*  Clinical Data:  Epigastric pain, Murphy's sign  ULTRASOUND ABDOMEN:  Technique:  Sonography of upper abdominal structures was performed.  Comparison:  None  Gallbladder:  Shadowing calculi within gallbladder, including a 2.8 cm diameter calculus at the gallbladder neck, non mobile.  Question additional gallbladder sludge.  Gallbladder wall normal thickness. Sonographic Murphy's sign present. No definite pericholecystic fluid  Common bile duct:  Normal caliber 3 mm diameter  Liver:  Normal appearance  IVC:  Normal appearance  Pancreas:  Normal appearance  Spleen:  Normal appearance, 4.8 cm length  Right kidney:  10.4 cm length.  Normal cortical thickness and echogenicity.  Minimal pelviectasis without gross hydronephrosis or mass.  Left kidney:  10.2 cm length.  Normal cortical thickness and echogenicity.  Left pelviectasis without gross hydronephrosis or mass.  Aorta:  Normal caliber  Other:  No free fluid. Bilateral ureteral jets identified at urinary bladder.  IMPRESSION: Cholelithiasis with a 2.8 cm diameter gallstones fixed in position at the gallbladder neck with associated sonographic Murphy's sign, cannot exclude early acute cholecystitis. No biliary dilatation. Minimal bilateral renal pelviectasis without gross hydronephrosis.  Original Report Authenticated By: Lollie Marrow, M.D.    Review of Systems  Constitutional: Negative.  Negative for fever, chills,  weight loss, malaise/fatigue and diaphoresis.  HENT: Negative.   Eyes: Negative.   Respiratory: Negative.   Gastrointestinal: Positive for heartburn and nausea. Negative for vomiting, abdominal pain, diarrhea, constipation, blood in stool and melena.  Genitourinary: Negative.   Musculoskeletal: Negative.   Skin: Negative.    Neurological: Negative.  Negative for weakness.  Endo/Heme/Allergies: Negative.   Psychiatric/Behavioral: Negative.     Blood pressure 125/87, pulse 79, temperature 98.3 F (36.8 C), temperature source Oral, resp. rate 16, last menstrual period 12/03/2011, SpO2 100.00%. Physical Exam  Constitutional: She is oriented to person, place, and time. She appears well-developed and well-nourished.  HENT:  Head: Normocephalic and atraumatic.  Nose: Nose normal.  Mouth/Throat: Oropharynx is clear and moist. No oropharyngeal exudate.  Eyes: Conjunctivae and EOM are normal. Pupils are equal, round, and reactive to light. Right eye exhibits no discharge. Left eye exhibits no discharge. No scleral icterus.  Neck: Normal range of motion. Neck supple. No JVD present. No tracheal deviation present. No thyromegaly present.  Cardiovascular: Normal rate, regular rhythm, normal heart sounds and intact distal pulses.  Exam reveals no gallop and no friction rub.   No murmur heard. Respiratory: Effort normal and breath sounds normal. No stridor. No respiratory distress. She has no wheezes. She has no rales. She exhibits no tenderness.  GI: Soft. Bowel sounds are normal. She exhibits no distension and no mass. There is tenderness. There is no rebound and no guarding.  Musculoskeletal: She exhibits no edema and no tenderness.  Lymphadenopathy:    She has no cervical adenopathy.  Neurological: She is alert and oriented to person, place, and time.  Skin: Skin is warm and dry. No rash noted. No erythema. No pallor.  Psychiatric: She has a normal mood and affect.    Assessment/Plan 1. Cholelithiasis vs Cholycistitis  Plan: Lap chole today by Dr. Luisa Hart.    Holly Waters 12/10/2011, 1:01 PM

## 2011-12-11 ENCOUNTER — Encounter (INDEPENDENT_AMBULATORY_CARE_PROVIDER_SITE_OTHER): Payer: Self-pay

## 2011-12-11 ENCOUNTER — Encounter (HOSPITAL_COMMUNITY): Payer: Self-pay | Admitting: *Deleted

## 2011-12-11 LAB — COMPREHENSIVE METABOLIC PANEL
ALT: 58 U/L — ABNORMAL HIGH (ref 0–35)
AST: 42 U/L — ABNORMAL HIGH (ref 0–37)
CO2: 25 mEq/L (ref 19–32)
Chloride: 103 mEq/L (ref 96–112)
Creatinine, Ser: 0.69 mg/dL (ref 0.50–1.10)
GFR calc Af Amer: >90 mL/min (ref >90)
GFR calc non Af Amer: >90 mL/min (ref >90)
Glucose, Bld: 106 mg/dL — ABNORMAL HIGH (ref 70–99)
Total Bilirubin: 0.3 mg/dL (ref 0.3–1.2)

## 2011-12-11 LAB — CBC
MCH: 31.4 pg (ref 26.0–34.0)
MCHC: 33 g/dL (ref 30.0–36.0)
Platelets: 209 10*3/uL (ref 150–400)
RBC: 3.41 MIL/uL — ABNORMAL LOW (ref 3.87–5.11)

## 2011-12-11 MED ORDER — OXYCODONE-ACETAMINOPHEN 5-325 MG PO TABS: 1.0000 | ORAL_TABLET | ORAL | Status: AC | PRN

## 2011-12-11 MED FILL — Iohexol Inj 300 MG/ML: INTRAMUSCULAR | Qty: 10 | Status: AC

## 2011-12-11 NOTE — ED Provider Notes (Signed)
Medical screening examination/treatment/procedure(s) were conducted as a shared visit with non-physician practitioner(s) and myself.  I personally evaluated the patient during the encounter  Havyn Ramo, MD 12/11/11 1126 

## 2011-12-11 NOTE — Progress Notes (Signed)
Patient d/c home with husband Left floor via wheelchair No c/o pain at d/c  Verbalized understanding of d/c instructions, RX's, and when to follow up with DR. Seaver Machia [employer]

## 2011-12-11 NOTE — Discharge Summary (Signed)
Follow up in a couple of weeks.  Back to work next week

## 2011-12-11 NOTE — Discharge Instructions (Signed)
[facility] ______[facility] °LAPAROSCOPIC SURGERY: POST OP INSTRUCTIONS °Always review your discharge instruction sheet given to you by the facility where your surgery was performed. °IF YOU HAVE DISABILITY OR FAMILY LEAVE FORMS, YOU MUST BRING THEM TO THE OFFICE FOR PROCESSING.   °DO NOT GIVE THEM TO YOUR DOCTOR. ° °1. A prescription for pain medication may be given to you upon discharge.  Take your pain medication as prescribed, if needed.  If narcotic pain medicine is not needed, then you may take acetaminophen (Tylenol) or ibuprofen (Advil) as needed. °2. Take your usually prescribed medications unless otherwise directed. °3. If you need a refill on your pain medication, please contact your pharmacy.  They will contact our office to request authorization. Prescriptions will not be filled after 5pm or on week-ends. °4. You should follow a light diet the first few days after arrival home, such as soup and crackers, etc.  Be sure to include lots of fluids daily. °5. Most patients will experience some swelling and bruising in the area of the incisions.  Ice packs will help.  Swelling and bruising can take several days to resolve.  °6. It is common to experience some constipation if taking pain medication after surgery.  Increasing fluid intake and taking a stool softener (such as Colace) will usually help or prevent this problem from occurring.  A mild laxative (Milk of Magnesia or Miralax) should be taken according to package instructions if there are no bowel movements after 48 hours. °7. Unless discharge instructions indicate otherwise, you may remove your bandages 24-48 hours after surgery, and you may shower at that time.  You may have steri-strips (small skin tapes) in place directly over the incision.  These strips should be left on the skin for 7-10 days.  If your surgeon used skin glue on the incision, you may shower in 24 hours.  The glue will flake off over the next 2-3 weeks.  Any sutures or  staples will be removed at the office during your follow-up visit. °8. ACTIVITIES:  You may resume regular (light) daily activities beginning the next day--such as daily self-care, walking, climbing stairs--gradually increasing activities as tolerated.  You may have sexual intercourse when it is comfortable.  Refrain from any heavy lifting or straining until approved by your doctor. °a. You may drive when you are no longer taking prescription pain medication, you can comfortably wear a seatbelt, and you can safely maneuver your car and apply brakes. °b. RETURN TO WORK:  __1 week________________________________________________________ °9. You should see your doctor in the office for a follow-up appointment approximately 2-3 weeks after your surgery.  Make sure that you call for this appointment within a day or two after you arrive home to insure a convenient appointment time. °10. OTHER INSTRUCTIONS: __________________________________________________________________________________________________________________________ __________________________________________________________________________________________________________________________ °WHEN TO CALL YOUR DOCTOR: °1. Fever over 101.0 °2. Inability to urinate °3. Continued bleeding from incision. °4. Increased pain, redness, or drainage from the incision. °5. Increasing abdominal pain ° °The clinic staff is available to answer your questions during regular business hours.  Please don’t hesitate to call and ask to speak to one of the nurses for clinical concerns.  If you have a medical emergency, go to the nearest emergency room or call 911.  A surgeon from [facility] is always on call at the hospital. °[address], [address], [place], [place]  [address] ? [address] [address], [place],    [address] °[phone] ? [phone] ? FAX [phone] °Web   site: [email or web address] ° °

## 2011-12-11 NOTE — Discharge Summary (Signed)
  Physician Discharge Summary  Patient ID: Holly Waters MRN: [id number] DOB/AGE: [id number] 37 y.o.  Admit date: 12/10/2011 Discharge date: 12/11/2011  Admitting Diagnosis: Calculus of gallbladder with acute cholecystitis, without mention of obstruction  Discharge Diagnosis Patient Active Problem List   Diagnosis Date Noted  . URI 10/03/2008  . ALLERGIC RHINITIS 10/03/2008    Consultants none  Procedures Laparoscopic Cholecystectomy  Hospital Course: 36 yr old female who presented to [facility] with abdominal pain.  Work up revealed calculus of the gallbladder with acute cholecystitis without obstruction.  She was taken to the OR and underwent the procedure listed above.  She tolerated this well and post-operatively had no major problems.  Her diet was advanced, she was able to void, ambulate and her pain was well controlled.  At time of discharge, her vitals were stable, her incisions were c/d/i, she was tolerating a regular diet and ready to go home.    Medication List  As of 12/11/2011  8:49 AM   TAKE these medications         drospirenone-ethinyl estradiol 3-0.02 MG tablet   Commonly known as: YAZ,GIANVI,LORYNA   Take 1 tablet by mouth daily.      fluticasone 50 MCG/ACT nasal spray   Commonly known as: FLONASE   Place 2 sprays into the nose daily as needed. allergies      oxyCODONE-acetaminophen 5-325 MG per tablet   Commonly known as: PERCOCET/ROXICET   Take 1-2 tablets by mouth every 4 (four) hours as needed.             Follow-up Information    Call CORNETT,THOMAS A., MD. (Please call our office to make an appointment to see Dr. Luisa Hart in 2 weeks)    Contact information:   [facility] [address] [place] [address] [phone]          Signed: Denny Levy [facility] [phone]  12/11/2011, 8:49 AM

## 2012-01-02 ENCOUNTER — Ambulatory Visit (INDEPENDENT_AMBULATORY_CARE_PROVIDER_SITE_OTHER): Payer: BC Managed Care – PPO | Admitting: Surgery

## 2012-01-02 ENCOUNTER — Encounter (INDEPENDENT_AMBULATORY_CARE_PROVIDER_SITE_OTHER): Payer: Self-pay | Admitting: Surgery

## 2012-01-02 VITALS — BP 112/78 | HR 72 | Temp 98.2°F | Resp 12 | Ht 65.5 in | Wt 134.0 lb

## 2012-01-02 DIAGNOSIS — Z9889 Other specified postprocedural states: Secondary | ICD-10-CM

## 2012-01-02 NOTE — Progress Notes (Signed)
NAME: Holly Waters       DOB: 14-Jan-1976           DATE: 01/02/2012       ZOX:[id number]   CC: Postop laparoscopic cholecystectomy  HPI:  This patient underwent a laparoscopic cholecystectomy and  operative cholangiogram on 12/15/2011. She is in for her first postoperative visit. She notes that her incisional pain has resolved. Her preoperative symptoms have improved. She is not having problems with nausea, vomiting, diarrhea, fevers, chills, or urinary symptoms. She is tolerating diet. She feels that she is progressing well and nearly back to normal. PE:  VS: BP 112/78  Pulse 72  Temp 98.2 F (36.8 C) (Temporal)  Resp 12  Ht 5' 5.5" (1.664 m)  Wt 134 lb (60.782 kg)  BMI 21.96 kg/m2  LMP 12/03/2011  General: The patient is alert and appears comfortable, NAD.  Abdomen: Soft and benign. The incisions are healing nicely. There are no apparent problems.  Data reviewed: IOC:  normal Pathology:  high grade dysplasia  Gallstones    Impression:  The patient appears to be doing well, with improvement in her symptoms.  Plan:  She may resume full activity and regular diet. She  will followup with [place] on a p.r.n. basis. I did tell her that she may still have some foods that cause indigestion and ask her to call us if there are any questions, problems or concerns.

## 2012-01-02 NOTE — Patient Instructions (Signed)
Return as needed

## 2012-01-07 ENCOUNTER — Other Ambulatory Visit: Payer: Self-pay | Admitting: Obstetrics and Gynecology

## 2012-01-17 ENCOUNTER — Encounter (INDEPENDENT_AMBULATORY_CARE_PROVIDER_SITE_OTHER): Payer: Self-pay

## 2012-07-23 IMAGING — US US ABDOMEN COMPLETE
1 series · 13 of 25 positions shown · non-contrast
Comparison: None

CLINICAL DATA: Epigastric pain, Murphy's sign

ULTRASOUND ABDOMEN:
TECHNIQUE: Sonography of upper abdominal structures was performed.

[Series 1: us abdomen complete · 0.28mm/px · 13 of 88 slices shown]
[im 1/88]
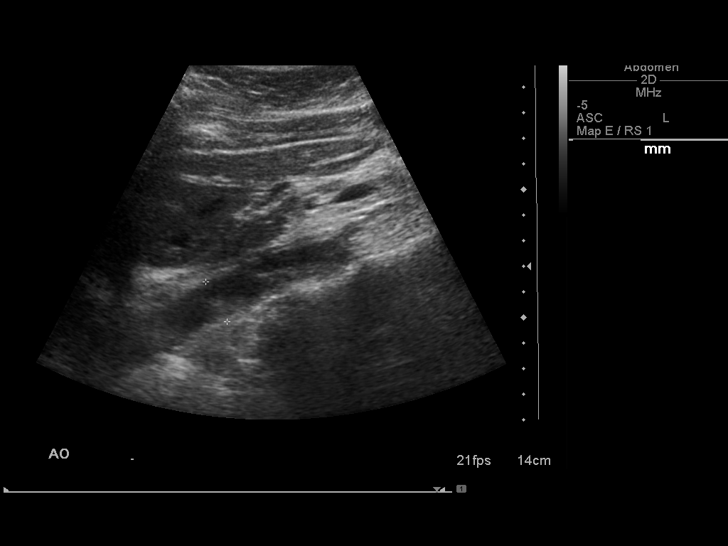
[im 8/88]
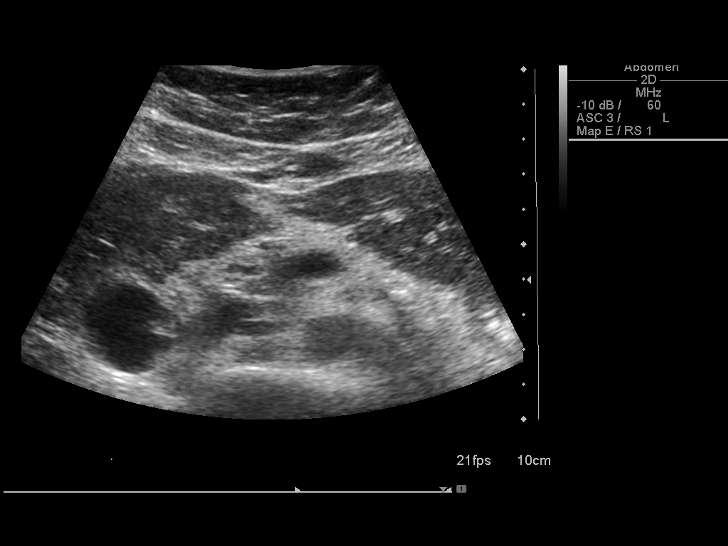
[im 15/88]
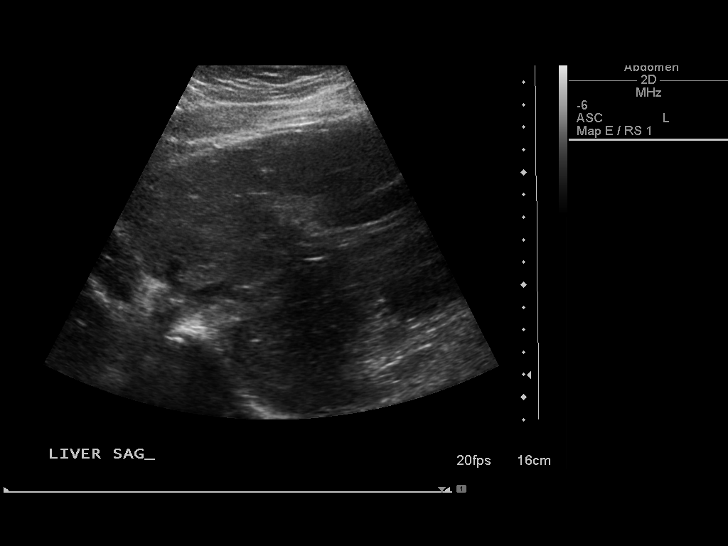
[im 22/88]
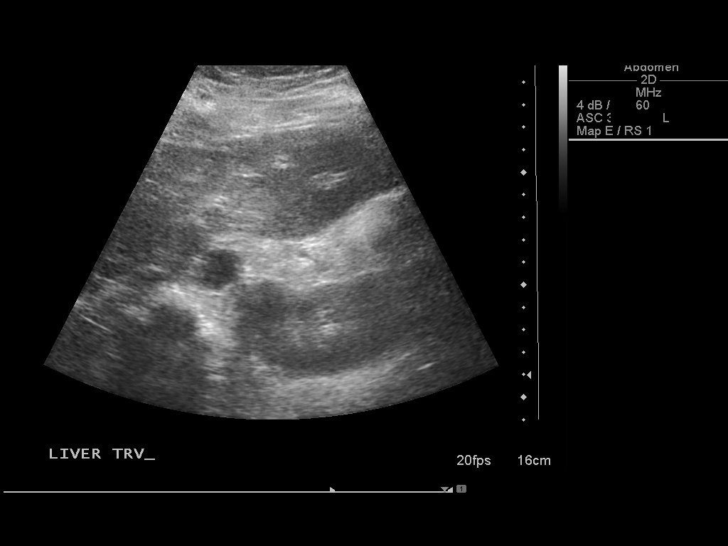
[im 30/88]
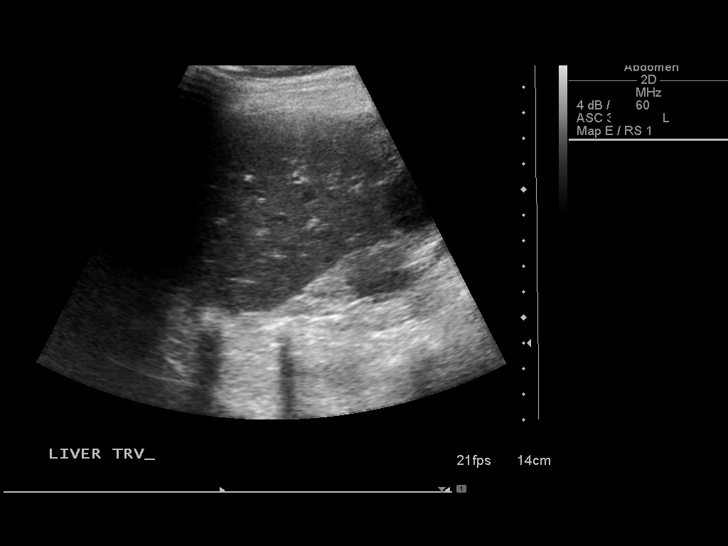
[im 37/88]
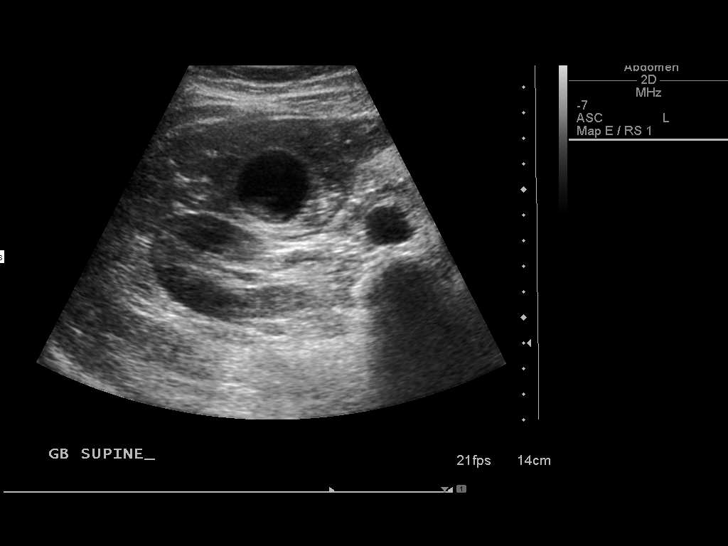
[im 44/88]
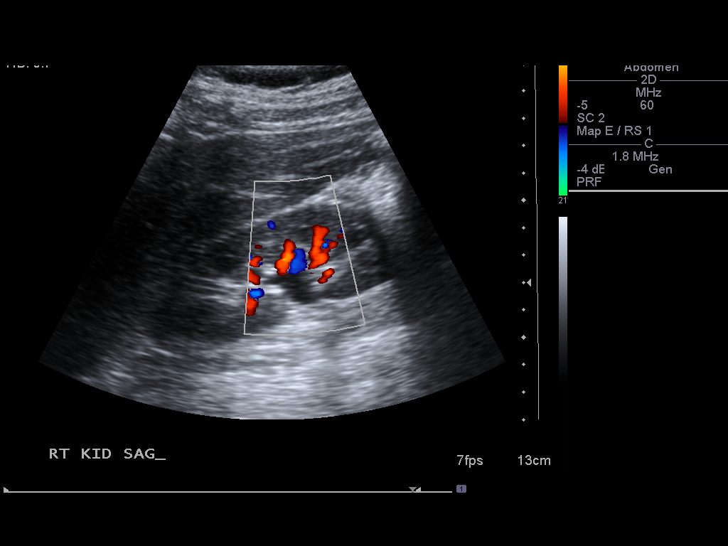
[im 51/88]
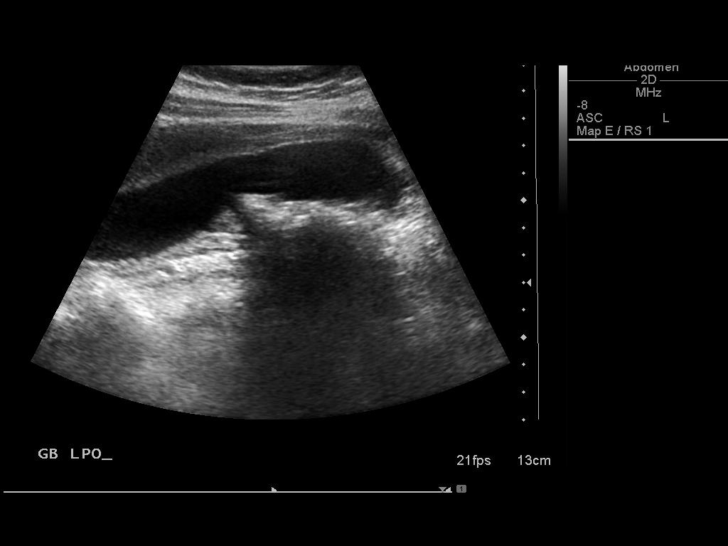
[im 59/88]
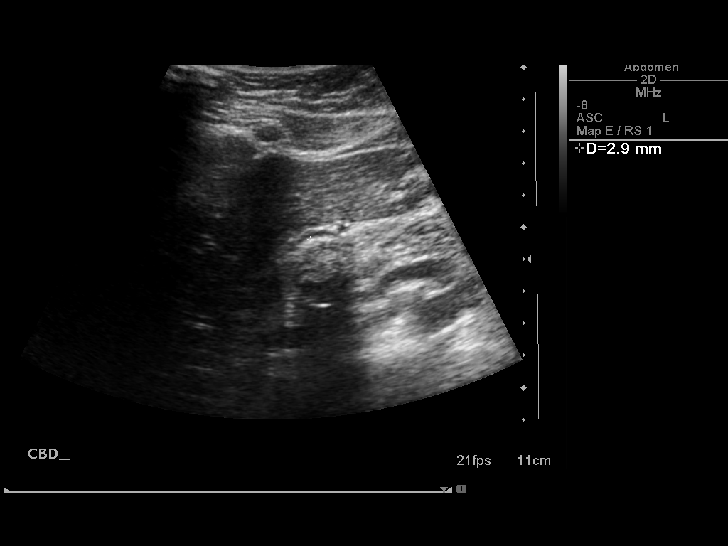
[im 66/88]
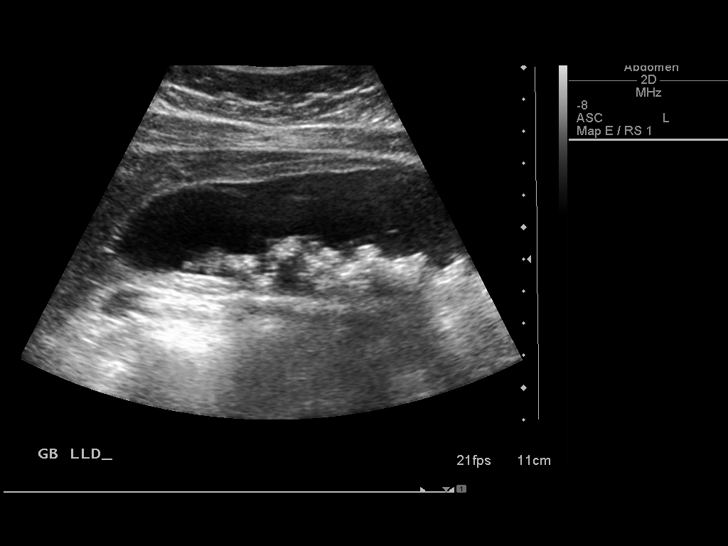
[im 73/88]
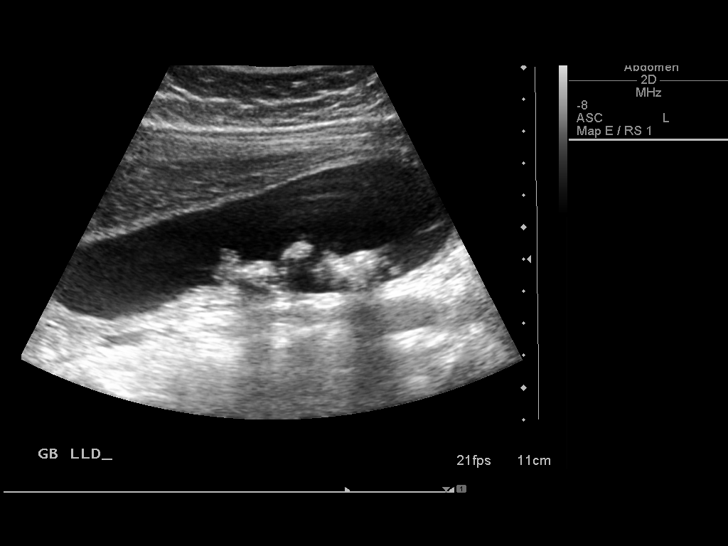
[im 80/88]
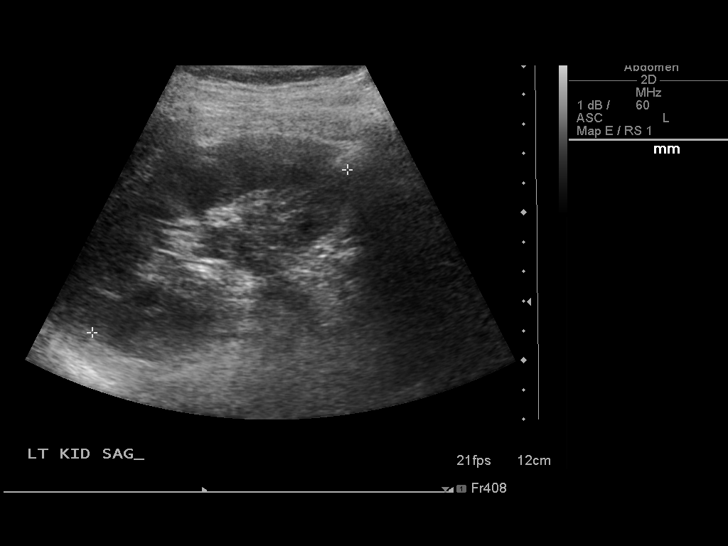
[im 88/88]
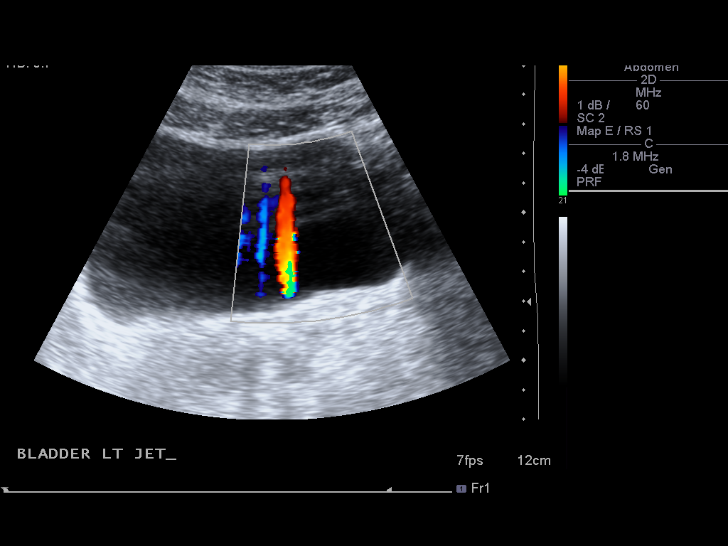

[13 of 25 positions shown; findings below may reference images not displayed]

Gallbladder:  Shadowing calculi within gallbladder, including a
cm diameter calculus at the gallbladder neck, non mobile.  Question
additional gallbladder sludge.  Gallbladder wall normal thickness.
Sonographic Murphy's sign present. No definite pericholecystic
fluid

Common bile duct:  Normal caliber 3 mm diameter

Liver:  Normal appearance

IVC:  Normal appearance

Pancreas:  Normal appearance

Spleen:  Normal appearance, 4.8 cm length

Right kidney:  10.4 cm length.  Normal cortical thickness and
echogenicity.  Minimal pelviectasis without gross hydronephrosis or
mass.

Left kidney:  10.2 cm length.  Normal cortical thickness and
echogenicity.  Left pelviectasis without gross hydronephrosis or
mass.

Aorta:  Normal caliber

Other:  No free fluid.
Bilateral ureteral jets identified at urinary bladder.
IMPRESSION: Cholelithiasis with a 2.8 cm diameter gallstones fixed in position
at the gallbladder neck with associated sonographic Murphy's sign,
cannot exclude early acute cholecystitis.
No biliary dilatation.
Minimal bilateral renal pelviectasis without gross hydronephrosis.

## 2012-07-23 IMAGING — RF DG CHOLANGIOGRAM OPERATIVE
1 series · 4 of 4 positions shown · non-contrast
Comparison: Abdominal ultrasound 12/10/2011.

CLINICAL DATA: 36-year-old female with cholelithiasis, epigastric
pain, Murphy's sign.

INTRAOPERATIVE CHOLANGIOGRAM
TECHNIQUE: Cholangiographic images from the C-arm fluoroscopic
device were submitted for interpretation post-operatively.  Please
see the procedural report for the amount of contrast and the
fluoroscopy time utilized.

[Series 1: run · 4 of 57 frames shown]
[frame 1/57]
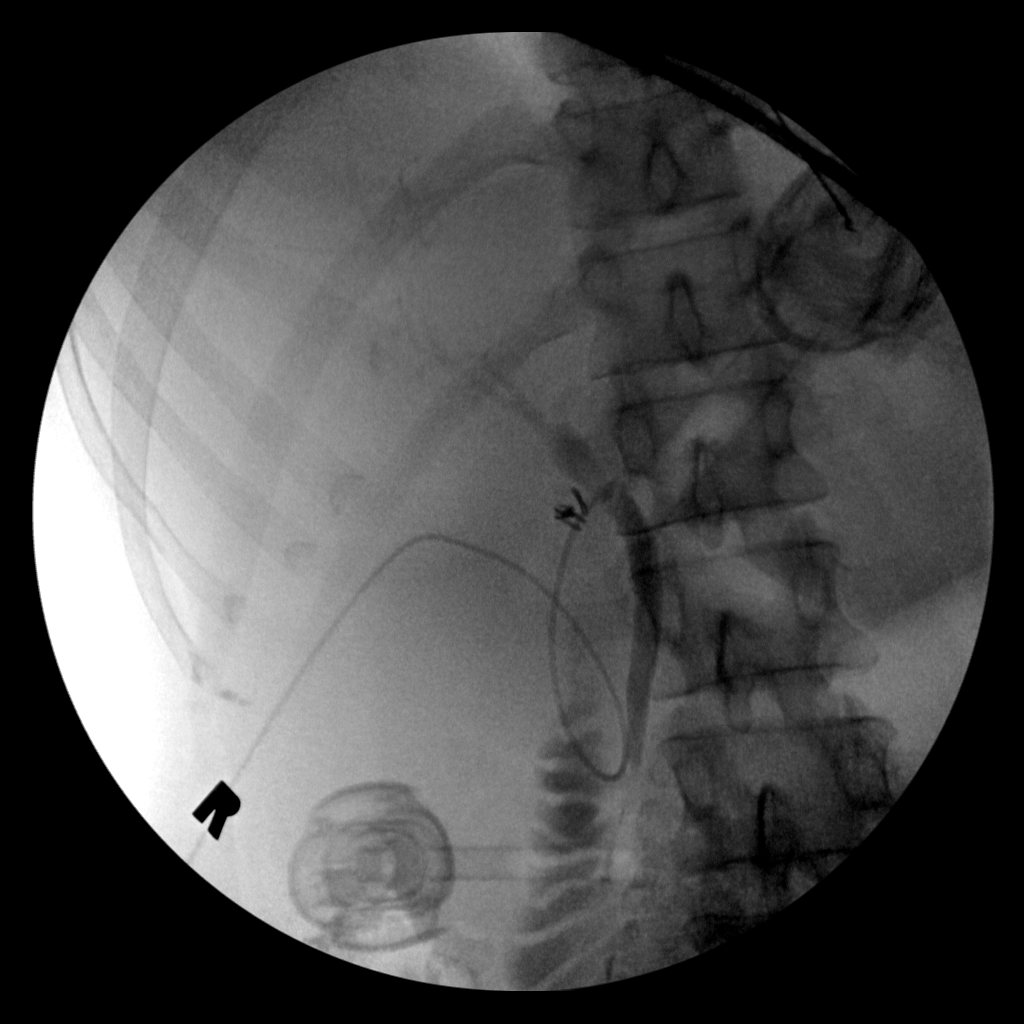
[frame 9/57]
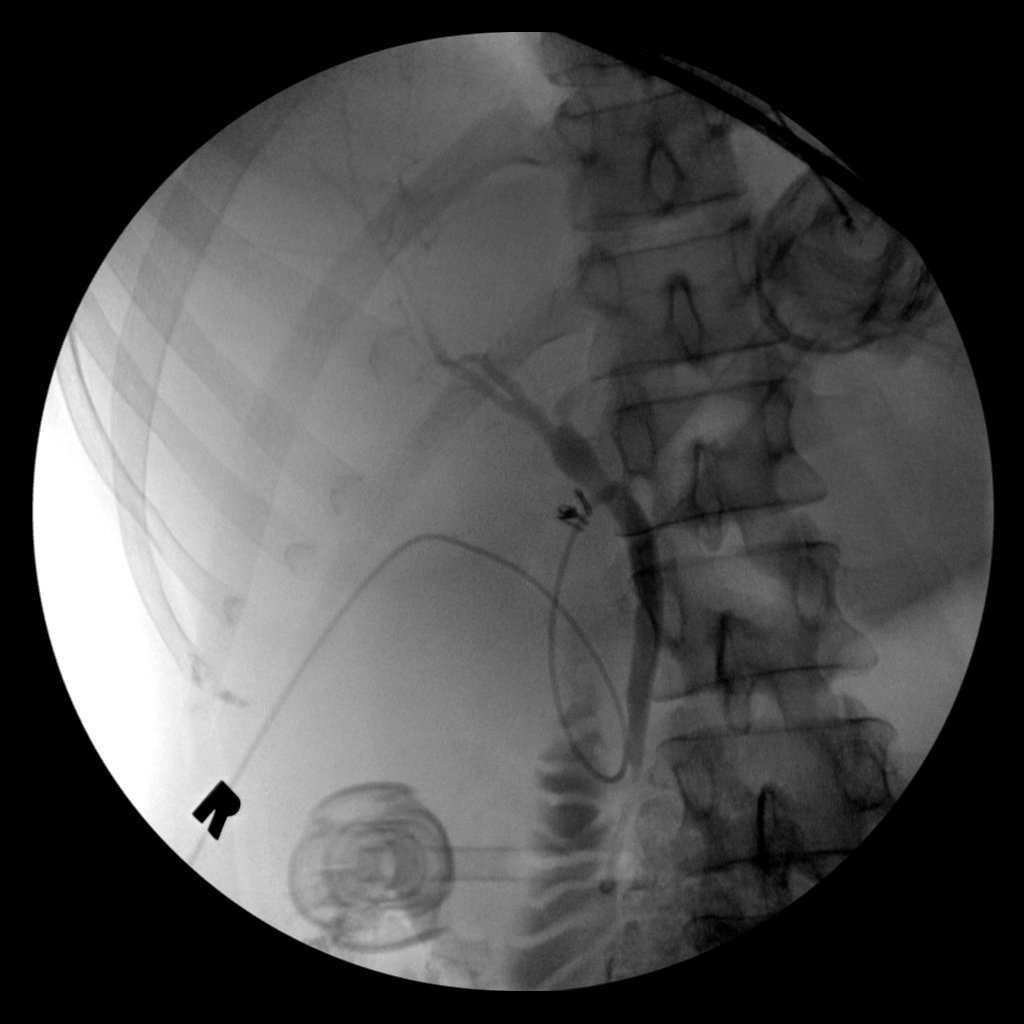
[frame 29/57]
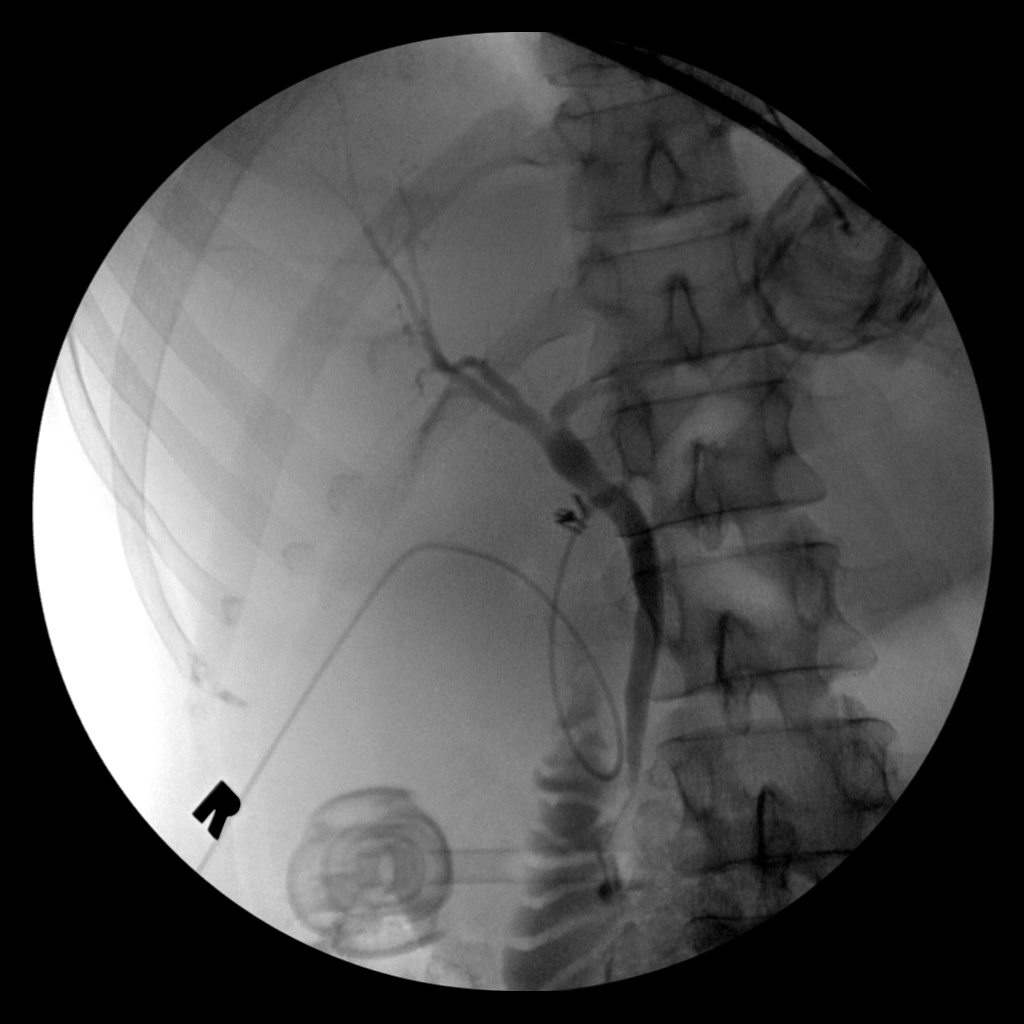
[frame 49/57]
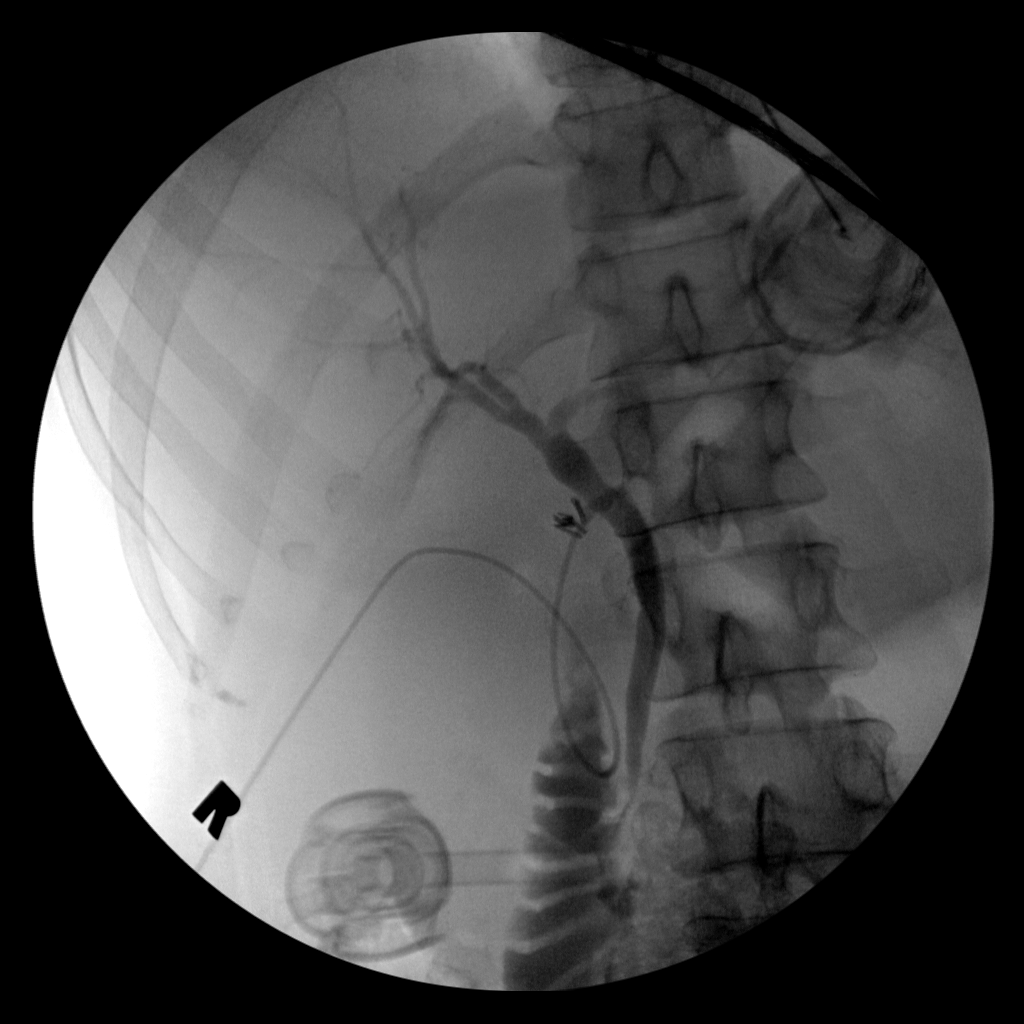

[4 of 4 positions shown; findings below may reference images not displayed]

FINDINGS: Multiple intraoperative fluoroscopic views of the right
upper quadrant.  Surgical clips at the level of the cystic duct
which is cannulated.  Contrast injection demonstrates a nondilated
intra and extrahepatic biliary tree with prompt emptying of
contrast to the duodenum.  No filling defect or extravasation.
IMPRESSION: Negative intraoperative cholangiogram.

## 2013-01-11 ENCOUNTER — Other Ambulatory Visit: Payer: Self-pay | Admitting: Obstetrics and Gynecology

## 2014-01-13 ENCOUNTER — Other Ambulatory Visit: Payer: Self-pay | Admitting: Obstetrics and Gynecology

## 2014-01-14 LAB — CYTOLOGY - PAP

## 2014-03-08 ENCOUNTER — Encounter: Payer: Self-pay | Admitting: Internal Medicine

## 2014-03-08 ENCOUNTER — Ambulatory Visit (INDEPENDENT_AMBULATORY_CARE_PROVIDER_SITE_OTHER): Payer: BC Managed Care – PPO | Admitting: Internal Medicine

## 2014-03-08 VITALS — BP 122/80 | Temp 98.2°F | Ht 65.0 in | Wt 133.2 lb

## 2014-03-08 DIAGNOSIS — R109 Unspecified abdominal pain: Secondary | ICD-10-CM

## 2014-03-08 DIAGNOSIS — Z23 Encounter for immunization: Secondary | ICD-10-CM

## 2014-03-08 LAB — POCT URINALYSIS DIPSTICK
Bilirubin, UA: NEGATIVE
Blood, UA: NEGATIVE
Glucose, UA: NEGATIVE
Ketones, UA: NEGATIVE
Leukocytes, UA: NEGATIVE
Nitrite, UA: NEGATIVE
PH UA: 6.5
PROTEIN UA: NEGATIVE
UROBILINOGEN UA: 0.2

## 2014-03-08 NOTE — Progress Notes (Signed)
Pre visit review using our clinic review tool, if applicable. No additional management support is needed unless otherwise documented below in the visit note.  Chief Complaint  Patient presents with  . Rt side pain    Under her ribs.  Started 2-3 weeks ago.  Lasts for a few seconds and then will go away.    HPI: Patient Holly Waters  comes in today for SDA for  new problem evaluation.  Onset a few weeks ago once in a day sharp pain under right rib cage right upper quadrant without radiation that lasts for seconds  And then comes   Sporadic  . Yesterday had more frequently .  Doesn't last long enough but if that lasted longer it would be a 5 or 6/10 pain No assoc sx.   Triggers  Not gi .   Not at night .  Uncertain able to exercise Do pilates and core exercise . Marland Kitchen. Last OV was in 2013    July  She had lap choley in 2013  ROS: See pertinent positives and negatives per HPI. No history of trauma cough shortness of breath hemoptysis injury to the chest wall.no change bowel habits  No ms sx  Nl periods on ocps for a while no hx vt   Past Medical History  Diagnosis Date  . Allergic rhinitis    Past Surgical History  Procedure Laterality Date  . Cholecystectomy  12/10/2011    Procedure: LAPAROSCOPIC CHOLECYSTECTOMY WITH INTRAOPERATIVE CHOLANGIOGRAM;  Surgeon: Clovis Puhomas A. Cornett, MD;  Location: [facility] OR;  Service: General;  Laterality: N/A;    Family History  Problem Relation Age of Onset  . Healthy Mother   . Hypertrophic cardiomyopathy Mother   . Hypertension Mother   . Multiple sclerosis Father   . Hypertension Father     both parents  . Diabetes Father   . Cancer Maternal Aunt     bone    History   Social History  . Marital Status: Married    Spouse Name: N/A    Number of Children: N/A  . Years of Education: N/A   Social History Main Topics  . Smoking status: Never Smoker   . Smokeless tobacco: None  . Alcohol Use: Yes     Comment: soically  . Drug Use: No  . Sexual  Activity: None   Other Topics Concern  . None   Social History Narrative   Married   Regular Exercise   hh of 2  Pet cats   Masters degree in works  Psychologist, sport and exerciseMarketing   Pos dairy no mild   G0P0   Etoh 1 per day max    Neg td FA    Outpatient Encounter Prescriptions as of 03/08/2014  Medication Sig  . drospirenone-ethinyl estradiol (YAZ,GIANVI,LORYNA) 3-0.02 MG tablet Take 1 tablet by mouth daily.  . fluticasone (FLONASE) 50 MCG/ACT nasal spray Place 2 sprays into the nose daily as needed. allergies    EXAM:  BP 122/80  Temp(Src) 98.2 F (36.8 C) (Oral)  Ht 5\' 5"  (1.651 m)  Wt 133 lb 3.2 oz (60.419 kg)  BMI 22.17 kg/m2  Body mass index is 22.17 kg/(m^2).  GENERAL: vitals reviewed and listed above, alert, oriented, appears well hydrated and in no acute distress HEENT: atraumatic, conjunctiva  clear, no obvious abnormalities on inspection of external nose and ears OP : no lesion edema or exudate  NECK: no obvious masses on inspection palpation  LUNGS: clear to auscultation bilaterally, no wheezes, rales or rhonchi,  good air movement chest wall within normal limits no focal tenderness Abdomen:  Sof,t normal bowel sounds without hepatosplenomegaly, no guarding rebound or masses no CVA tenderness Well healed laparoscopy scars area of report to his right upper quadrant but has no pain on exam today nor abnormalities CV: HRRR, no clubbing cyanosis or  peripheral edema nl cap refill  MS: moves all extremities without noticeable focal  abnormality PSYCH: pleasant and cooperative, no obvious depression or anxiety Urinalysis is clear ASSESSMENT AND PLAN:  Discussed the following assessment and plan:  Right-sided abdominal pain of unknown cause - Shooting and fleeting pain no associated sx normal exam question related to lap scar? - Plan: POCT urinalysis dipstick  Need for prophylactic vaccination and inoculation against influenza - Plan: Flu Vaccine QUAD 36+ mos PF IM (Fluarix Quad  PF) Mid cycle.  Dr Dareen Pianoanderson.  Just had year luy pap in august .  -Patient advised to return or notify health care team  if symptoms worsen ,persist or new concerns arise.  Patient Instructions  ? caue of pain but exam ius reassuring    Sounds like a nerve or twinge possible  If could be from old scaring   Exam is good .   Lets obvserve  No change in activity . follow up if  persistent or progressive  1-2 months or as needed .       [name] M.D.

## 2014-03-08 NOTE — Patient Instructions (Addendum)
?   caue of pain but exam ius reassuring    Sounds like a nerve or twinge possible  If could be from old scaring   Exam is good .   Lets obvserve  No change in activity . follow up if  persistent or progressive  1-2 months or as needed .

## 2015-02-13 ENCOUNTER — Other Ambulatory Visit: Payer: Self-pay | Admitting: Obstetrics and Gynecology

## 2015-02-15 LAB — CYTOLOGY - PAP

## 2015-08-29 ENCOUNTER — Encounter: Payer: Self-pay | Admitting: Family Medicine

## 2015-08-29 ENCOUNTER — Ambulatory Visit (INDEPENDENT_AMBULATORY_CARE_PROVIDER_SITE_OTHER): Payer: BLUE CROSS/BLUE SHIELD | Admitting: Family Medicine

## 2015-08-29 VITALS — BP 112/80 | HR 109 | Temp 98.7°F | Ht 65.35 in | Wt 138.1 lb

## 2015-08-29 DIAGNOSIS — J069 Acute upper respiratory infection, unspecified: Secondary | ICD-10-CM | POA: Diagnosis not present

## 2015-08-29 DIAGNOSIS — J309 Allergic rhinitis, unspecified: Secondary | ICD-10-CM | POA: Diagnosis not present

## 2015-08-29 NOTE — Progress Notes (Signed)
Subjective:    Patient ID: Holly Waters, female    DOB: [birth year], 40 y.o.   MRN: [id number]  HPI  [name] is a 40 y.o.female here today complaining of 3 days of respiratory symptoms. She started with odynophagia and nasal congestion on 08/26/15, dysphonia 08/27/15. She has had cough, productive with brownish sputum, attributed to post nasal drainage. + Nasal congestion, rhinorrhea. No fever, chills, myalgias, sinus pain, abdominal pain, nausea, or vomiting.  + Hx of recent travel, flew from [place] 08/27/15. No sick contact. No known insect bite. + Hx of allergies, seasonal. Currently she is on Flonase nasal spray, which she doe snot use daily.   She has tried OTC Cold medications, today she took Mucinex. Symptoms otherwise stable, odynophagia and dysphonia resolved.    Review of Systems  Constitutional: Negative for fever, activity change, appetite change and fatigue.  HENT: Positive for congestion, postnasal drip, sore throat and voice change. Negative for ear pain, facial swelling, mouth sores, sinus pressure, sneezing and trouble swallowing.   Eyes: Negative for discharge, redness and itching.  Respiratory: Positive for cough. Negative for shortness of breath and wheezing.   Cardiovascular: Negative.   Gastrointestinal: Negative for nausea, vomiting, abdominal pain and diarrhea.  Musculoskeletal: Negative for myalgias, back pain, joint swelling and neck pain.  Skin: Negative for rash.  Allergic/Immunologic: Positive for environmental allergies.  Neurological: Negative for weakness, numbness and headaches.  Hematological: Negative for adenopathy.   Current Outpatient Prescriptions on File Prior to Visit  Medication Sig Dispense Refill  . drospirenone-ethinyl estradiol (YAZ,GIANVI,LORYNA) 3-0.02 MG tablet Take 1 tablet by mouth daily.    . fluticasone (FLONASE) 50 MCG/ACT nasal spray Place 2 sprays into the nose daily as needed. allergies     No current  facility-administered medications on file prior to visit.     Past Medical History  Diagnosis Date  . Allergic rhinitis     Social History   Social History  . Marital Status: Married    Spouse Name: N/A  . Number of Children: N/A  . Years of Education: N/A   Social History Main Topics  . Smoking status: Never Smoker   . Smokeless tobacco: None  . Alcohol Use: Yes     Comment: soically  . Drug Use: No  . Sexual Activity: Not Asked   Other Topics Concern  . 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      Objective:   Physical Exam  Constitutional: She is oriented to person, place, and time. She appears well-developed and well-nourished.  HENT:  Head: Normocephalic and atraumatic.  Right Ear: Tympanic membrane, external ear and ear canal normal.  Left Ear: Tympanic membrane, external ear and ear canal normal.  Nose: Rhinorrhea present. No mucosal edema. Right sinus exhibits no maxillary sinus tenderness and no frontal sinus tenderness. Left sinus exhibits no maxillary sinus tenderness and no frontal sinus tenderness.  Mouth/Throat: Uvula is midline, oropharynx is clear and moist and mucous membranes are normal. No oropharyngeal exudate, posterior oropharyngeal edema or posterior oropharyngeal erythema.  Post  nasal drainage, mild turbinates hypertrophy  Eyes: Conjunctivae are normal. Right eye exhibits no discharge. Left eye exhibits no discharge.  Neck: Neck supple.  Cardiovascular: Normal rate and regular rhythm.   HR 84/min by my count.  Pulmonary/Chest: Effort normal and breath sounds normal. No respiratory distress. She has no wheezes. She has no rales.  Lymphadenopathy:    She has no cervical  adenopathy.  Neurological: She is alert and oriented to person, place, and time.  Skin: Skin is warm. No rash noted.  Psychiatric: She has a normal mood and affect.  Well groomed and good eye contact.       Assessment & Plan:   Holly Waters was seen today for sinus problem.  Diagnoses and all orders for this visit:  URI, acute  Allergic rhinitis, unspecified allergic rhinitis type   I explained symptoms could be caused by a mild viral illness vs allergies; in both cases symptom tic treatment is recommended. Flonase nasal spray recommended daily 1 spray bid. OTC antihistaminic, Allegra or Zyrtec are good options. Steam shower/inhalations. Monitor for fever or worsening symptoms, in which case she was instructed to seem medical evaluation. She voices understanding and agrees with plan.  Holly Kryder G. [place], MD  [facility]. [place] [facility].

## 2015-08-29 NOTE — Patient Instructions (Signed)
Things we discussed today: Symptoms today seem to be related to a mild viral illness and/or allergies, in both cases symptomatic treatment is recommended. OTC Allegra 180 mg daily OR Zyrtec 10 mg daily. Steam inhalations.  Please follow with you primary care physician if symptoms get worse and later was no fever develops.

## 2015-08-29 NOTE — Progress Notes (Signed)
Pre visit review using our clinic review tool, if applicable. No additional management support is needed unless otherwise documented below in the visit note. 

## 2016-02-20 ENCOUNTER — Other Ambulatory Visit: Payer: Self-pay | Admitting: Obstetrics and Gynecology

## 2016-02-21 LAB — CYTOLOGY - PAP
